# Patient Record
Sex: Female | Born: 1960 | Race: White | Hispanic: No | Marital: Single | Smoking: Former smoker
Health system: Southern US, Community
[De-identification: ages and names within clinical notes are randomized; demographics above are authoritative.]

## PROBLEM LIST (undated history)

## (undated) HISTORY — PX: HAND TENDON SURGERY: SHX663

---

## 2012-04-07 ENCOUNTER — Emergency Department (INDEPENDENT_AMBULATORY_CARE_PROVIDER_SITE_OTHER)
Admission: EM | Admit: 2012-04-07 | Discharge: 2012-04-07 | Disposition: A | Discharge status: Home / Self Care | Payer: Self-pay | Source: Home / Self Care | Attending: Emergency Medicine | Admitting: Emergency Medicine

## 2012-04-07 ENCOUNTER — Encounter (HOSPITAL_COMMUNITY): Payer: Self-pay | Admitting: Emergency Medicine

## 2012-04-07 ENCOUNTER — Emergency Department (INDEPENDENT_AMBULATORY_CARE_PROVIDER_SITE_OTHER): Payer: Self-pay

## 2012-04-07 DIAGNOSIS — M722 Plantar fascial fibromatosis: Secondary | ICD-10-CM

## 2012-04-07 MED ORDER — MELOXICAM 7.5 MG PO TABS: 7.5000 mg | ORAL_TABLET | Freq: Every day | ORAL | Status: AC

## 2012-04-07 NOTE — ED Notes (Signed)
Instructed on use of crutches.

## 2012-04-07 NOTE — ED Provider Notes (Signed)
History     CSN: [id number]  Arrival date & time 04/07/12  1126   First MD Initiated Contact with Patient 04/07/12 1140      Chief Complaint  Patient presents with  . Foot Pain    (Consider location/radiation/quality/duration/timing/severity/associated sxs/prior treatment) HPI Comments: Patient presents urgent care this morning complaining of pain on the undersurface of her right foot and on the lateral aspect of it. Describes it it hurts to walk on it or perform certain movements. Pain on the plantar surface radiates all way to her heel. She denies any recent injuries, falls or twisting motions or torsion that could have caused pain she works as a Armed forces operational officer. Denies any numbness, tingling sensations or redness or increased warmth. Denies any histories of plantar fasciitis or tendinitis of the foot.  Patient is a 51 y.o. female presenting with lower extremity pain. The history is provided by the patient.  Foot Pain This is a new problem. The current episode started 12 to 24 hours ago. The problem occurs constantly. The problem has not changed since onset.Pertinent negatives include no abdominal pain. Nothing relieves the symptoms. She has tried nothing for the symptoms.    History reviewed. No pertinent past medical history.  History reviewed. No pertinent past surgical history.  No family history on file.  History  Substance Use Topics  . Smoking status: Current Everyday Smoker -- 5 years    Types: Cigarettes  . Smokeless tobacco: Not on file  . Alcohol Use: Yes    OB History    Grav Para Term Preterm Abortions TAB SAB Ect Mult Living                  Review of Systems  Constitutional: Negative for fever, chills, activity change and appetite change.  Gastrointestinal: Negative for abdominal pain.  Musculoskeletal: Negative for myalgias and joint swelling.  Skin: Negative for color change, pallor, rash and wound.  Neurological: Negative for weakness and numbness.     Allergies  Penicillins  Home Medications   Current Outpatient Rx  Name Route Sig Dispense Refill  . MELOXICAM 7.5 MG PO TABS Oral Take 1 tablet (7.5 mg total) by mouth daily. 14 tablet 0    BP 120/68  Pulse 77  Temp 98.6 F (37 C) (Oral)  Resp 16  SpO2 100%  Physical Exam  Nursing note and vitals reviewed. Constitutional: Vital signs are normal. She appears well-developed and well-nourished.  Non-toxic appearance. She does not have a sickly appearance. She does not appear ill.  Musculoskeletal: She exhibits tenderness. She exhibits no edema.       Feet:  Skin: Skin is warm. No erythema.    ED Course  Procedures (including critical care time)  Labs Reviewed - No data to display Dg Foot Complete Right  04/07/2012  *RADIOLOGY REPORT*  Clinical Data: Pain laterally.  No trauma.  RIGHT FOOT COMPLETE - 3+ VIEW  Comparison: None.  Findings: No fracture or dislocation.  No plain film evidence of stress injury.  No plantar spur.  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  Degenerative changes.  IMPRESSION: No acute abnormality.  Please see above.   Original Report Authenticated By: Fuller Canada, M.D.      1. Plantar fasciitis of right foot       MDM  Patient with plantar foot pain in the lateral aspect of her foot. With negative x-rays ruling out a spontaneous fifth metatarsal fracture. Patient has a foot tendinitis along  with plantar fasciitis. Patient has been prescribed he 2 week course of meloxicam. Provide her with a postop shoe. Occurs to follow-up with orthopedic provider for further treatment options the pain was to persist beyond 2 weeks.        Jimmie Molly, MD 04/07/12 2103

## 2012-04-07 NOTE — ED Notes (Signed)
This am, woke with right foot pain, lateral aspect starting at forefoot radiating to heel. No known injury. Works as Armed forces operational officer.

## 2013-10-03 ENCOUNTER — Emergency Department (INDEPENDENT_AMBULATORY_CARE_PROVIDER_SITE_OTHER)
Admission: EM | Admit: 2013-10-03 | Discharge: 2013-10-03 | Disposition: A | Discharge status: Home / Self Care | Payer: Self-pay | Source: Home / Self Care | Attending: Family Medicine | Admitting: Family Medicine

## 2013-10-03 ENCOUNTER — Encounter (HOSPITAL_COMMUNITY): Payer: Self-pay | Admitting: Emergency Medicine

## 2013-10-03 DIAGNOSIS — L723 Sebaceous cyst: Secondary | ICD-10-CM

## 2013-10-03 MED ORDER — HYDROCODONE-ACETAMINOPHEN 5-325 MG PO TABS: 2.0000 | ORAL_TABLET | ORAL | Status: DC | PRN

## 2013-10-03 MED ORDER — SULFAMETHOXAZOLE-TRIMETHOPRIM 800-160 MG PO TABS: 1.0000 | ORAL_TABLET | Freq: Two times a day (BID) | ORAL | Status: DC

## 2013-10-03 NOTE — ED Notes (Signed)
Pt has    A marble  Size  Lump epigastric  Area  That  Is  Round  Red   And  painfull     Pt    Noticed  The  Symptoms  X  3  Day

## 2013-10-03 NOTE — Discharge Instructions (Signed)

## 2013-10-03 NOTE — ED Provider Notes (Signed)
CSN: [id number]     Arrival date & time 10/03/13  1107 History   First MD Initiated Contact with Patient 10/03/13 1302     Chief Complaint  Patient presents with  . Recurrent Skin Infections     (Consider location/radiation/quality/duration/timing/severity/associated sxs/prior Treatment) Patient is a 53 y.o. female presenting with abscess. The history is provided by the patient. No language interpreter was used.  Abscess Location:  Torso Torso abscess location:  R chest Size:  2 Abscess quality: painful and redness   Red streaking: no   Duration:  3 days Progression:  Worsening Pain details:    Quality:  No pain   Severity:  No pain   Duration:  3 days   Timing:  Constant   Progression:  Worsening Relieved by:  Nothing Worsened by:  Nothing tried   History reviewed. No pertinent past medical history. History reviewed. No pertinent past surgical history. History reviewed. No pertinent family history. History  Substance Use Topics  . Smoking status: Current Every Day Smoker -- 5 years    Types: Cigarettes  . Smokeless tobacco: Not on file  . Alcohol Use: Yes   OB History   Grav Para Term Preterm Abortions TAB SAB Ect Mult Living                 Review of Systems  Skin: Positive for wound.  All other systems reviewed and are negative.      Allergies  Penicillins  Home Medications  No current outpatient prescriptions on file. BP 144/90  Pulse 65  Temp(Src) 97.9 F (36.6 C) (Oral)  Resp 16  SpO2 97% Physical Exam  Vitals reviewed. Constitutional: She appears well-developed and well-nourished.  Pulmonary/Chest: Effort normal.  2cm erythema   Musculoskeletal: Normal range of motion.  Neurological: She is alert.  Skin: There is erythema.    ED Course  INCISION AND DRAINAGE Date/Time: 10/03/2013 1:45 PM Performed by: Elson AreasSOFIA, Haizlee Henton K Authorized by: Clementeen GrahamOREY, EVAN, S Consent: Verbal consent not obtained. Risks and benefits: risks, benefits and  alternatives were discussed Consent given by: patient Patient identity confirmed: verbally with patient Time out: Immediately prior to procedure a "time out" was called to verify the correct patient, procedure, equipment, support staff and site/side marked as required. Type: cyst Anesthesia: local infiltration Local anesthetic: lidocaine 2% without epinephrine Scalpel size: 11 Needle gauge: 18 Incision type: single straight Complexity: simple Drainage: purulent Drainage amount: moderate Wound treatment: wound left open Patient tolerance: Patient tolerated the procedure well with no immediate complications. Comments: sebaceous material is expressed    (including critical care time) Labs Review Labs Reviewed - No data to display Imaging Review No results found.    MDM   Final diagnoses:  Sebaceous cyst    Bactrim and and hydrocodone    Elson AreasLeslie K Bridger Pizzi, PA-C 10/03/13 1349  Lonia SkinnerLeslie K Dames QuarterSofia, [place] JerseyPA-C 10/03/13 1355

## 2013-10-07 NOTE — ED Provider Notes (Signed)
Medical screening examination/treatment/procedure(s) were performed by a resident physician or non-physician practitioner and as the supervising physician I was immediately available for consultation/collaboration.  Reighan Hipolito, MD    Marita Burnsed S Zadin Lange, MD 10/07/13 1633 

## 2014-11-06 ENCOUNTER — Ambulatory Visit (INDEPENDENT_AMBULATORY_CARE_PROVIDER_SITE_OTHER): Payer: 59 | Admitting: Family Medicine

## 2014-11-06 ENCOUNTER — Ambulatory Visit (INDEPENDENT_AMBULATORY_CARE_PROVIDER_SITE_OTHER): Payer: 59

## 2014-11-06 VITALS — BP 124/76 | HR 94 | Temp 98.4°F | Resp 17 | Ht 65.5 in | Wt 190.0 lb

## 2014-11-06 DIAGNOSIS — S76311A Strain of muscle, fascia and tendon of the posterior muscle group at thigh level, right thigh, initial encounter: Secondary | ICD-10-CM | POA: Diagnosis not present

## 2014-11-06 DIAGNOSIS — Z131 Encounter for screening for diabetes mellitus: Secondary | ICD-10-CM

## 2014-11-06 DIAGNOSIS — E875 Hyperkalemia: Secondary | ICD-10-CM | POA: Diagnosis not present

## 2014-11-06 DIAGNOSIS — M7918 Myalgia, other site: Secondary | ICD-10-CM

## 2014-11-06 DIAGNOSIS — M791 Myalgia: Secondary | ICD-10-CM | POA: Diagnosis not present

## 2014-11-06 DIAGNOSIS — Z13220 Encounter for screening for lipoid disorders: Secondary | ICD-10-CM

## 2014-11-06 DIAGNOSIS — Z130 Encounter for screening for diseases of the blood and blood-forming organs and certain disorders involving the immune mechanism: Secondary | ICD-10-CM | POA: Diagnosis not present

## 2014-11-06 DIAGNOSIS — S76011A Strain of muscle, fascia and tendon of right hip, initial encounter: Secondary | ICD-10-CM

## 2014-11-06 MED ORDER — MELOXICAM 7.5 MG PO TABS: 7.5000 mg | ORAL_TABLET | Freq: Every day | ORAL | Status: DC

## 2014-11-06 MED ORDER — CYCLOBENZAPRINE HCL 10 MG PO TABS: 10.0000 mg | ORAL_TABLET | Freq: Two times a day (BID) | ORAL | Status: DC | PRN

## 2014-11-06 NOTE — Progress Notes (Addendum)
[facility] [address] [place] [address] [phone]  Date:  11/06/2014   Name:  Alexandra Houston   DOB:  11-08-1960   MRN:  [id number]  PCP:  No primary care provider on file.    Chief Complaint: Back Pain and pain in buttocks region   History of Present Illness:  Alexandra Houston is a 54 y.o. very pleasant female patient who presents with the following:  Here today as a new patient with back pain.  I have seen her in the past for a WC injury She has been working hard on exercise and diet change- she has lost about 12 lbs and is very pleased.  However about 3 weeks ago she had a mis-step while power walking and pulled her lower back/ buttock.  She did not fall, but wrenched her right buttock/ lower back.  She has used some heat and backed off her walking to lesser distance.  However she continues to have painShe has to sit on the left side of her behind because it hurts to sit on the right.  She does not have pain down her legs.   She has also tried some ibuprofen and massaging the area.   She would also like to catch up on her health maintenance and have labs- she only had a yogurt at 6am, which was approx 9 hours ago now  There are no active problems to display for this patient.   No past medical history on file.  No past surgical history on file.  History  Substance Use Topics  . Smoking status: Former Smoker -- 5 years    Types: Cigarettes    Quit date: 09/07/2013  . Smokeless tobacco: Not on file  . Alcohol Use: 0.0 oz/week    0 Standard drinks or equivalent per week    No family history on file.  Allergies  Allergen Reactions  . Penicillins     Medication list has been reviewed and updated.  No current outpatient prescriptions on file prior to visit.   No current facility-administered medications on file prior to visit.    Review of Systems:  As per HPI- otherwise negative.   Physical Examination: Filed Vitals:   11/06/14 1403   BP: 124/76  Pulse: 94  Temp: 98.4 F (36.9 C)  Resp: 17   Filed Vitals:   11/06/14 1403  Height: 5' 5.5" (1.664 m)  Weight: 190 lb (86.183 kg)   Body mass index is 31.13 kg/(m^2). Ideal Body Weight: Weight in (lb) to have BMI = 25: 152.2  GEN: WDWN, NAD, Non-toxic, A & O x 3, overweight, looks well HEENT: Atraumatic, Normocephalic. Neck supple. No masses, No LAD. Ears and Nose: No external deformity. CV: RRR, No M/G/R. No JVD. No thrill. No extra heart sounds. PULM: CTA B, no wheezes, crackles, rhonchi. No retractions. No resp. distress. No accessory muscle use. EXTR: No c/c/e NEURO Normal gait.  PSYCH: Normally interactive. Conversant. Not depressed or anxious appearing.  Calm demeanor.  Right buttock is sore to palpation over the sciatic notch.  No rash or lesion Normal BLE strength, sensation and DTR, negative SLR bilaterally  [facility] reading (PRIMARY) by  Dr. Patsy Lager. Right hip: negative  Assessment and Plan: Strain of right buttock, initial encounter - Plan: cyclobenzaprine (FLEXERIL) 10 MG tablet, meloxicam (MOBIC) 7.5 MG tablet  Pain in right buttock - Plan: DG HIP UNILAT WITH PELVIS 2-3 VIEWS RIGHT  Screening for hyperlipidemia - Plan: Lipid panel  Screening for  deficiency anemia - Plan: CBC  Screening for diabetes mellitus - Plan: Comprehensive metabolic panel  Treat for buttock strain with mobic and flexeril, encouraged heat and tennis ball massage Labs pending as above for screening See patient instructions for more details.     Signed Abbe AmsterdamJessica Jakell Trusty, MD  Received her elevated K level 3/22: will call her later this morning and ask her to come by for a lab draw only to recheck K

## 2014-11-06 NOTE — Patient Instructions (Addendum)
I will be in touch with your labs   It appears that you have strained a muscle in your buttock.  I do think that this will get well, but it can be an annoying injury!  Use the mobic as needed for pain during the day and the flexeril at night (you can also take flexeril during the day if you are not driving)  Try massage with a tennis ball, heat and stretching.  If you are not improving over the next 7- 10 days please let me know

## 2014-11-07 ENCOUNTER — Other Ambulatory Visit (INDEPENDENT_AMBULATORY_CARE_PROVIDER_SITE_OTHER): Payer: 59 | Admitting: Radiology

## 2014-11-07 ENCOUNTER — Encounter: Payer: Self-pay | Admitting: Family Medicine

## 2014-11-07 ENCOUNTER — Telehealth: Payer: Self-pay

## 2014-11-07 DIAGNOSIS — E875 Hyperkalemia: Secondary | ICD-10-CM

## 2014-11-07 LAB — LIPID PANEL
Cholesterol: 265 mg/dL — ABNORMAL HIGH (ref 0–200)
HDL: 51 mg/dL (ref >=46)
LDL CALC: 196 mg/dL — AB (ref 0–99)
Total CHOL/HDL Ratio: 5.2 Ratio
Triglycerides: 88 mg/dL (ref <150)
VLDL: 18 mg/dL (ref 0–40)

## 2014-11-07 LAB — COMPREHENSIVE METABOLIC PANEL
ALK PHOS: 83 U/L (ref 39–117)
ALT: 14 U/L (ref 0–35)
AST: 27 U/L (ref 0–37)
Albumin: 4.7 g/dL (ref 3.5–5.2)
BUN: 16 mg/dL (ref 6–23)
CO2: 21 mEq/L (ref 19–32)
CREATININE: 0.51 mg/dL (ref 0.50–1.10)
Calcium: 9.2 mg/dL (ref 8.4–10.5)
Chloride: 105 mEq/L (ref 96–112)
Glucose, Bld: 88 mg/dL (ref 70–99)
Potassium: 6.3 mEq/L (ref 3.5–5.3)
Sodium: 138 mEq/L (ref 135–145)
Total Bilirubin: 0.6 mg/dL (ref 0.2–1.2)
Total Protein: 7.5 g/dL (ref 6.0–8.3)

## 2014-11-07 LAB — CBC
HCT: 43.5 % (ref 36.0–46.0)
Hemoglobin: 14.6 g/dL (ref 12.0–15.0)
MCH: 30.3 pg (ref 26.0–34.0)
MCHC: 33.6 g/dL (ref 30.0–36.0)
MCV: 90.2 fL (ref 78.0–100.0)
MPV: 12.8 fL — AB (ref 8.6–12.4)
PLATELETS: 170 10*3/uL (ref 150–400)
RBC: 4.82 MIL/uL (ref 3.87–5.11)
RDW: 13.7 % (ref 11.5–15.5)
WBC: 7.7 10*3/uL (ref 4.0–10.5)

## 2014-11-07 LAB — POTASSIUM: POTASSIUM: 4.6 meq/L (ref 3.5–5.3)

## 2014-11-07 NOTE — Addendum Note (Signed)
Addended by: Pearline CablesOPLAND, Babbie Dondlinger C on: 11/07/2014 06:48 AM   Modules accepted: Orders

## 2014-11-07 NOTE — Telephone Encounter (Signed)
[facility] lab called to call report a high potassium of 6.3 but the blood was hemolyzed. I did not seed any notes on the lab. You probably already saw this. Dr. Patsy Lageropland

## 2014-11-07 NOTE — Telephone Encounter (Signed)
I have called pt but [facility] asked her to come in for a repeat K level today

## 2014-11-28 ENCOUNTER — Ambulatory Visit (INDEPENDENT_AMBULATORY_CARE_PROVIDER_SITE_OTHER): Payer: Worker's Compensation | Admitting: Family Medicine

## 2014-11-28 ENCOUNTER — Ambulatory Visit: Payer: Worker's Compensation

## 2014-11-28 VITALS — BP 120/72 | HR 94 | Temp 98.3°F | Resp 18 | Ht 66.0 in | Wt 188.0 lb

## 2014-11-28 DIAGNOSIS — W540XXA Bitten by dog, initial encounter: Secondary | ICD-10-CM

## 2014-11-28 DIAGNOSIS — S61451A Open bite of right hand, initial encounter: Secondary | ICD-10-CM | POA: Diagnosis not present

## 2014-11-28 DIAGNOSIS — Z23 Encounter for immunization: Secondary | ICD-10-CM

## 2014-11-28 NOTE — Patient Instructions (Signed)

## 2014-11-28 NOTE — Progress Notes (Signed)
   Subjective:    Patient ID: Alexandra Houston, female    DOB: Nov 19, 1960, 54 y.o.   MRN: [id number]  HPI Here today with a dog bite to her right hand.  This occurred around 11:30 today while grooming the animal She notes that her right thumb seems to be numb.    She does not know that date of her last tetanus shot-   She is right handed  Review of Systems As per HPI- otherwise negative.     Objective:   Physical Exam  Filed Vitals:   11/28/14 1305  BP: 120/72  Pulse: 94  Temp: 98.3 F (36.8 C)  Resp: 18    GEN: WDWN, NAD, Non-toxic, A & O x 3 HEENT: Atraumatic, Normocephalic. Neck supple. No masses, No LAD. Ears and Nose: No external deformity. CV: RRR, No M/G/R. No JVD. No thrill. No extra heart sounds. PULM: CTA B, no wheezes, crackles, rhonchi. No retractions. No resp. distress. No accessory muscle use. ABD: S, NT, ND, +BS. No rebound. No HSM. Right hand:  EXTR: No c/c/e NEURO Normal gait.  PSYCH: Normally interactive. Conversant. Not depressed or anxious appearing.  Calm demeanor.  Right hand: there is a small bite wound over the dorsum of the [facility] which is [facility], does not appear infected.  A couple of other tiny bite marks that do not appear to have broken the skin over the thenar eminence  She notes tenderness around the 1st MCP joint but otherwise no pain.  She does note that her thumb feels tingly to touch.  Normal strength, perfusion and cap refill of the hand.   [facility] reading (PRIMARY) by  Dr. Patsy Lageropland. Right hand: negative for acute fracture. Possible tiny chip at thumb IP joint but pt is not tender there RIGHT HAND - COMPLETE 3+ VIEW  COMPARISON: None.  FINDINGS: There is no evidence of fracture or dislocation. There is no evidence of arthropathy or other focal bone abnormality. Soft tissues are unremarkable.  IMPRESSION: Negative.  Placed in a left thumb spica splint    Assessment & Plan:  Dog bite of right hand, initial encounter - Plan: DG Hand  Complete Right  Immunization due - Plan: Tdap vaccine greater than or equal to 7yo IM  Bite wound, right hand/   Placed in a thumb spica splint OOW until recheck here on Thursday Tdap updated Watch for any sign of infection  Realized that I am not sure if she did a bite form.  [facility]- if this was not done please let me know- we can do Thursday if needed or she may come by and do tomorrow.  Certainly the dog that was at her facility is presumed to be UTD on immunizations but we will be sure all is ok.

## 2014-11-30 ENCOUNTER — Ambulatory Visit (INDEPENDENT_AMBULATORY_CARE_PROVIDER_SITE_OTHER): Payer: Worker's Compensation | Admitting: Family Medicine

## 2014-11-30 ENCOUNTER — Telehealth: Payer: Self-pay

## 2014-11-30 VITALS — BP 118/70 | HR 76 | Temp 97.9°F | Resp 18 | Ht 67.0 in | Wt 188.0 lb

## 2014-11-30 DIAGNOSIS — W540XXD Bitten by dog, subsequent encounter: Secondary | ICD-10-CM | POA: Diagnosis not present

## 2014-11-30 DIAGNOSIS — M79601 Pain in right arm: Secondary | ICD-10-CM | POA: Diagnosis not present

## 2014-11-30 DIAGNOSIS — S76311A Strain of muscle, fascia and tendon of the posterior muscle group at thigh level, right thigh, initial encounter: Secondary | ICD-10-CM | POA: Diagnosis not present

## 2014-11-30 DIAGNOSIS — S61451D Open bite of right hand, subsequent encounter: Secondary | ICD-10-CM | POA: Diagnosis not present

## 2014-11-30 DIAGNOSIS — S76011A Strain of muscle, fascia and tendon of right hip, initial encounter: Secondary | ICD-10-CM

## 2014-11-30 MED ORDER — PREDNISONE 20 MG PO TABS: ORAL_TABLET | ORAL | Status: DC

## 2014-11-30 MED ORDER — CYCLOBENZAPRINE HCL 10 MG PO TABS: 10.0000 mg | ORAL_TABLET | Freq: Two times a day (BID) | ORAL | Status: DC | PRN

## 2014-11-30 MED ORDER — MELOXICAM 7.5 MG PO TABS: 7.5000 mg | ORAL_TABLET | Freq: Every day | ORAL | Status: DC

## 2014-11-30 NOTE — Telephone Encounter (Signed)
This has been taken care of by dr copland.

## 2014-11-30 NOTE — Telephone Encounter (Signed)
Patient is calling because she was seen today and has a question a work note she was given by Dr. Patsy Lageropland. Patient states that she was under the impressions that her note was supposed to state that she will be excused from work starting today until her next appointment April 25th. Patient states that the note she has only excused her for one day. Patient states that she is coming back right now to get it straightened. Phone: [phone]

## 2014-11-30 NOTE — Patient Instructions (Signed)
Good to see you today-  Let's try using a short course of prednisone for the symptoms in your hand.  If you have any worsening or other concerns call me when you are out of town Otherwise let's plan to see you on Monday 4/25 for a recheck

## 2014-11-30 NOTE — Progress Notes (Signed)
Alexandra CongRachel Nethery 18-Jul-1961 54 y.o.   Chief Complaint  Patient presents with  . Follow-up    dog bite     Date of Injury: she was here following a dog bite that occurred on 4/12- she was not started on abx but was placed in a wrist splint. She was bitten on her right hand.  She is right hand cominant She is here today for a follow-up. Her hand still hurts if she tries to hold her dog clippers- she tried this am to see if she might be able to get back to work. She also tried just holding her brush or scissors and could not do this either. She has persistent pain and numbness in the thumb/ thenar eminence.  The thumb "feels sort of numb" which is worrying her.    She did complete a bite report at the time of the original bite and has heard from animal control   She has not noted any fever.  She has no heat in the hand, no redness and no pus.  She has not noted any sign of infection   Right hand film taken on 4/12  RIGHT HAND - COMPLETE 3+ VIEW  COMPARISON: None.  FINDINGS: There is no evidence of fracture or dislocation. There is no evidence of arthropathy or other focal bone abnormality. Soft tissues are unremarkable.  IMPRESSION: Negative.  History of Present Illness:  Presents for evaluation of work-related complaint.   ROS   Allergies  Allergen Reactions  . Penicillins     Current medications reviewed and updated. Past medical history, family history, social history have been reviewed and updated.   Physical Exam GEN: WDWN, NAD, Non-toxic, A & O x 3, overweight, looks well HEENT: Atraumatic, Normocephalic. Neck supple. No masses, No LAD. Ears and Nose: No external deformity. CV: RRR, No M/G/R. No JVD. No thrill. No extra heart sounds. PULM: CTA B, no wheezes, crackles, rhonchi. No retractions. No resp. distress. No accessory muscle use. EXTR: No c/c/e NEURO Normal gait.  PSYCH: Normally interactive. Conversant. Not depressed or anxious appearing.  Calm  demeanor.  Right hand: she has a small wound of over the dorsal right hand which is tender (over the [facility]nd [facility]) but appears to be healing well, no redness, pus, or heat.  She is still tender and slightly swollen over the thenar eminence.  She has slight tenderness with movement of the 1st MCP, but not at the IP joint of the thumb.  She has normal strength of both hand.  She does notice that sharp touch feels dull over the thumb only Assessment and Plan:  Pain of right upper extremity - Plan: predniSONE (DELTASONE) 20 MG tablet  Dog bite of right hand, subsequent encounter - Plan: predniSONE (DELTASONE) 20 MG tablet  Strain of right buttock, initial encounter - Plan: cyclobenzaprine (FLEXERIL) 10 MG tablet, meloxicam (MOBIC) 7.5 MG tablet  Here today to recheck dog bite of the right hand.  She has tried to use her grooming tools at home and found that she had too much pain. She has a pre-planned vacation next week and will be out of the state until Sunday 4/24.  Will take her OOW, and she will recheck her the following Monday 4/25.  Will try a short course of prednisone for the paraesthesias in her hand which are likely due to soft tissue swelling.  If the sensation change is not getting better or if any worsening she will let me know in the meantime.  Refilled  her flexeril and mobic, no NSAIDs while on prednisone

## 2014-12-11 ENCOUNTER — Ambulatory Visit (INDEPENDENT_AMBULATORY_CARE_PROVIDER_SITE_OTHER): Payer: Worker's Compensation | Admitting: Physician Assistant

## 2014-12-11 VITALS — BP 128/64 | HR 78 | Temp 97.6°F | Resp 17 | Ht 66.5 in | Wt 190.2 lb

## 2014-12-11 DIAGNOSIS — M6289 Other specified disorders of muscle: Secondary | ICD-10-CM

## 2014-12-11 DIAGNOSIS — R202 Paresthesia of skin: Secondary | ICD-10-CM | POA: Diagnosis not present

## 2014-12-11 DIAGNOSIS — R200 Anesthesia of skin: Secondary | ICD-10-CM

## 2014-12-11 DIAGNOSIS — R29898 Other symptoms and signs involving the musculoskeletal system: Secondary | ICD-10-CM

## 2014-12-11 NOTE — Progress Notes (Signed)
Alexandra CongRachel Houston February 24, 1961 54 y.o.   Chief Complaint  Patient presents with  . Follow-up    Workers Comp, Administrator, artsDog Bite to right hand     Date of Injury: 11/28/2014  History of Present Illness:  Patient is here for follow up after 12 days post dog injury to her right dominant hand.  Today, she reports no improvement of pain, numbness and tingling.  She is compliant with using her brace, she states.  She took the prednisone initially given, without any resolve.  She has pain at her 1st metacarpal with any pressure.  The pressure then incites numbness and tingling around to the dorsal side of 1st metacarpal and up the hand.  She has numbness, of her distal 1st digit.  She denies any fever, increased swelling, or redness.     Presents for evaluation of work-related complaint.   ROS ROS otherwise unremarkable unless listed above.   Allergies  Allergen Reactions  . Penicillins    Current medications reviewed and updated. Past medical history, family history, social history have been reviewed and updated.   Physical Exam  Constitutional: She is oriented to person, place, and time and well-developed, well-nourished, and in no distress. No distress.  Eyes: Pupils are equal, round, and reactive to light.  Cardiovascular: Normal rate.   Pulmonary/Chest: Effort normal. No respiratory distress.  Musculoskeletal:  Right metacarpal 1st digit tenderness upon palpation of MCP, this travels distally causing tingling and numbness.  Dorsum of hand with healing <cm wound, with very little erythema or swelling.  Decreased grip strength and opposition 2/5.  Negative Tinel, Phalen.  Positive Finkelstein.  Neurological: She is alert and oriented to person, place, and time.  Decreased distal sensation.  No dull sensation or distinction of dull and sharp pain. Proximal to DIP joint is intact sensation.  Skin: Skin is warm and dry.  Psychiatric: Affect and judgment normal.   BP 128/64 mmHg  Pulse 78   Temp(Src) 97.6 F (36.4 C) (Oral)  Resp 17  Ht 5' 6.5" (1.689 m)  Wt 190 lb 3.2 oz (86.274 kg)  BMI 30.24 kg/m2  SpO2 96%   Assessment and Plan: 54 year old female is here today for chief complaint of continued thumb pain and paresthesia.  Positive deQuervain's though this is not consistent with an injury and hx.   At this time, hand specialist, is appreciated at this time for consult.   -Restricted to wear arm brace to work.   -Referral placed. -Restriction of NO use of the right arm.  Continue meloxicam. -Restriction form notice given in carbon copy.   Numbness and tingling of left thumb - Plan: Ambulatory referral to Hand Surgery  Weakness of left hand - Plan: Ambulatory referral to Hand Surgery  Trena PlattStephanie Biruk Troia, PA-C [facility] 4/25/20161:48 PM

## 2014-12-21 ENCOUNTER — Telehealth: Payer: Self-pay

## 2014-12-21 NOTE — Telephone Encounter (Signed)
Will notify xr.

## 2014-12-21 NOTE — Telephone Encounter (Signed)
Pt needs to pick up an xray disc done on 12/28/14 of LEFT hand.  Will need to pick up tomorrow to take to an ortho appt

## 2015-03-07 ENCOUNTER — Ambulatory Visit (INDEPENDENT_AMBULATORY_CARE_PROVIDER_SITE_OTHER): Payer: Commercial Managed Care - HMO | Admitting: Family Medicine

## 2015-03-07 ENCOUNTER — Ambulatory Visit (INDEPENDENT_AMBULATORY_CARE_PROVIDER_SITE_OTHER): Payer: Commercial Managed Care - HMO

## 2015-03-07 VITALS — BP 130/82 | HR 69 | Temp 98.2°F | Resp 18 | Ht 66.0 in | Wt 190.2 lb

## 2015-03-07 DIAGNOSIS — S92911A Unspecified fracture of right toe(s), initial encounter for closed fracture: Secondary | ICD-10-CM | POA: Diagnosis not present

## 2015-03-07 DIAGNOSIS — M79671 Pain in right foot: Secondary | ICD-10-CM

## 2015-03-07 NOTE — Patient Instructions (Signed)
You're fourth toe does appear to be broken in the middle portion of the first bone. It does not appear to affect the joints.  You can use the postop shoe for the next 2-3 weeks, buddy tape the third and fourth toe for the next 3-4 weeks, or until pain has improved. Elevate foot when seated tablet lessen pain and swelling. If any worsening or not improving in the next 4 weeks return for recheck.   Toe Fracture Your caregiver has diagnosed you as having a fractured toe. A toe fracture is a break in the bone of a toe. "Buddy taping" is a way of splinting your broken toe, by taping the broken toe to the toe next to it. This "buddy taping" will keep the injured toe from moving beyond normal range of motion. Buddy taping also helps the toe heal in a more normal alignment. It may take 6 to 8 weeks for the toe injury to heal. HOME CARE INSTRUCTIONS   Leave your toes taped together for as long as directed by your caregiver or until you see a doctor for a follow-up examination. You can change the tape after bathing. Always use a small piece of gauze or cotton between the toes when taping them together. This will help the skin stay dry and prevent infection.  Apply ice to the injury for 15-20 minutes each hour while awake for the first 2 days. Put the ice in a plastic bag and place a towel between the bag of ice and your skin.  After the first 2 days, apply heat to the injured area. Use heat for the next 2 to 3 days. Place a heating pad on the foot or soak the foot in warm water as directed by your caregiver.  Keep your foot elevated as much as possible to lessen swelling.  Wear sturdy, supportive shoes. The shoes should not pinch the toes or fit tightly against the toes.  Your caregiver may prescribe a rigid shoe if your foot is very swollen.  Your may be given crutches if the pain is too great and it hurts too much to walk.  Only take over-the-counter or prescription medicines for pain, discomfort, or  fever as directed by your caregiver.  If your caregiver has given you a follow-up appointment, it is very important to keep that appointment. Not keeping the appointment could result in a chronic or permanent injury, pain, and disability. If there is any problem keeping the appointment, you must call back to this facility for assistance. SEEK MEDICAL CARE IF:   You have increased pain or swelling, not relieved with medications.  The pain does not get better after 1 week.  Your injured toe is cold when the others are warm. SEEK IMMEDIATE MEDICAL CARE IF:   The toe becomes cold, numb, or white.  The toe becomes hot (inflamed) and red. Document Released: 08/01/2000 Document Revised: 10/27/2011 Document Reviewed: 03/20/2008 [facility] Patient Information 2015 [place], [place]. This information is not intended to replace advice given to you by your health care provider. Make sure you discuss any questions you have with your health care provider.

## 2015-03-07 NOTE — Progress Notes (Addendum)
Subjective:    Patient ID: Alexandra Houston, female    DOB: Jan 01, 1961, 54 y.o.   MRN: [id number] This chart was scribed for Meredith Staggers, MD by Jolene Provost, Medical Scribe. This patient was seen in Room 4 and the patient's care was started a 6:40 PM.  Chief Complaint  Patient presents with  . Foot Injury    right foot injury x 1 week. Canned food fell from grocery bag onto foot. Still hurting    HPI HPI Comments: Alexandra Houston is a 54 y.o. female who presents to [facility] complaining of a right foot injury that happened six days ago. Pt states she had a bag of groceries with cans in them, which broke and cans of foot fell on her right foot. Pt states her foot swelled and bruised. Pt states she has used ice, heat, and elevation without relief of her sx.     There are no active problems to display for this patient.  No past medical history on file. Past Surgical History  Procedure Laterality Date  . Hand tendon surgery    . Cesarean section     Allergies  Allergen Reactions  . Penicillins    Prior to Admission medications   Medication Sig Start Date End Date Taking? Authorizing Provider  cyclobenzaprine (FLEXERIL) 10 MG tablet Take 1 tablet (10 mg total) by mouth 2 (two) times daily as needed for muscle spasms. Patient not taking: Reported on 03/07/2015 11/30/14   Pearline Cables, MD  meloxicam (MOBIC) 7.5 MG tablet Take 1 tablet (7.5 mg total) by mouth daily. Patient not taking: Reported on 03/07/2015 11/30/14   Pearline Cables, MD   History   Social History  . Marital Status: Single    Spouse Name: N/A  . Number of Children: N/A  . Years of Education: N/A   Occupational History  . Not on file.   Social History Main Topics  . Smoking status: Former Smoker -- 5 years    Types: Cigarettes    Quit date: 09/07/2013  . Smokeless tobacco: Not on file  . Alcohol Use: 0.0 oz/week    0 Standard drinks or equivalent per week  . Drug Use: Not on file  . Sexual Activity: Not  on file   Other Topics Concern  . Not on file   Social History Narrative    Review of Systems  Constitutional: Negative for fever and chills.  Musculoskeletal: Positive for joint swelling and gait problem.  Skin: Positive for color change. Negative for wound.  Neurological: Negative for weakness and numbness.       Objective:   Physical Exam  Constitutional: She is oriented to person, place, and time. She appears well-developed and well-nourished. No distress.  HENT:  Head: Normocephalic and atraumatic.  Eyes: Pupils are equal, round, and reactive to light.  Neck: Neck supple.  Cardiovascular: Normal rate.   Pulmonary/Chest: Effort normal. No respiratory distress.  Musculoskeletal: She exhibits edema and tenderness.  Diffusely tender over distal third and fourth metatarsals with faint ecchymosis and swelling distally. Increasing tenderness moving distally ,third greater than fourth toe. NVI distally. Guarded ROM in the third and fourth toes.    Neurological: She is alert and oriented to person, place, and time. Coordination normal.  Skin: Skin is warm and dry. She is not diaphoretic.  Psychiatric: She has a normal mood and affect. Her behavior is normal.  Nursing note and vitals reviewed.  Filed Vitals:   03/07/15 1826  BP: 130/82  Pulse:  69  Temp: 98.2 F (36.8 C)  TempSrc: Oral  Resp: 18  Height: 5\' 6"  (1.676 m)  Weight: 190 lb 4 oz (86.297 kg)  SpO2: 98%   [facility] reading (PRIMARY) by  Dr. Neva SeatGreene:  Right foot: Fourth proximal phalanx fracture through middle aspect, does not appear to affect MTP or proximal interphalangeal joint. Minimal to no displacement.      Assessment & Plan:   Alexandra CongRachel Houston is a 54 y.o. female Right foot pain - Plan: DG Foot Complete Right  Toe fracture, right, closed, initial encounter  Appears to have nondisplaced or minimally displaced fourth proximal phalanx fracture. Does not appear to affect the joints spaces.    -Buddy taped to the  third toe, and postop shoe placed. Continue buddy taping for 3-4 weeks and postop shoe for a few weeks as needed for comfort.   Note provided for work if she needs to wear postop shoe at work.  - Discussed allowing skin between toes to air out during the day if she is not walking on it and when repeating buddy taping to place gauze between toes to lessen skin irritation.   -RTC precautions if still symptomatic in 3-4 weeks.  No orders of the defined types were placed in this encounter.   Patient Instructions  You're fourth toe does appear to be broken in the middle portion of the first bone. It does not appear to affect the joints.  You can use the postop shoe for the next 2-3 weeks, buddy tape the third and fourth toe for the next 3-4 weeks, or until pain has improved. Elevate foot when seated tablet lessen pain and swelling. If any worsening or not improving in the next 4 weeks return for recheck.   Toe Fracture Your caregiver has diagnosed you as having a fractured toe. A toe fracture is a break in the bone of a toe. "Buddy taping" is a way of splinting your broken toe, by taping the broken toe to the toe next to it. This "buddy taping" will keep the injured toe from moving beyond normal range of motion. Buddy taping also helps the toe heal in a more normal alignment. It may take 6 to 8 weeks for the toe injury to heal. HOME CARE INSTRUCTIONS   Leave your toes taped together for as long as directed by your caregiver or until you see a doctor for a follow-up examination. You can change the tape after bathing. Always use a small piece of gauze or cotton between the toes when taping them together. This will help the skin stay dry and prevent infection.  Apply ice to the injury for 15-20 minutes each hour while awake for the first 2 days. Put the ice in a plastic bag and place a towel between the bag of ice and your skin.  After the first 2 days, apply heat to the injured area. Use heat for the next  2 to 3 days. Place a heating pad on the foot or soak the foot in warm water as directed by your caregiver.  Keep your foot elevated as much as possible to lessen swelling.  Wear sturdy, supportive shoes. The shoes should not pinch the toes or fit tightly against the toes.  Your caregiver may prescribe a rigid shoe if your foot is very swollen.  Your may be given crutches if the pain is too great and it hurts too much to walk.  Only take over-the-counter or prescription medicines for pain, discomfort, or fever  as directed by your caregiver.  If your caregiver has given you a follow-up appointment, it is very important to keep that appointment. Not keeping the appointment could result in a chronic or permanent injury, pain, and disability. If there is any problem keeping the appointment, you must call back to this facility for assistance. SEEK MEDICAL CARE IF:   You have increased pain or swelling, not relieved with medications.  The pain does not get better after 1 week.  Your injured toe is cold when the others are warm. SEEK IMMEDIATE MEDICAL CARE IF:   The toe becomes cold, numb, or white.  The toe becomes hot (inflamed) and red. Document Released: 08/01/2000 Document Revised: 10/27/2011 Document Reviewed: 03/20/2008 [facility] Patient Information 2015 [place], [place]. This information is not intended to replace advice given to you by your health care provider. Make sure you discuss any questions you have with your health care provider.       I personally performed the services described in this documentation, which was scribed in my presence. The recorded information has been reviewed and considered, and addended by me as needed.

## 2015-03-09 ENCOUNTER — Ambulatory Visit (INDEPENDENT_AMBULATORY_CARE_PROVIDER_SITE_OTHER): Payer: Commercial Managed Care - HMO | Admitting: Family Medicine

## 2015-03-09 VITALS — BP 116/72 | HR 92 | Temp 98.6°F | Resp 18 | Ht 66.0 in | Wt 186.0 lb

## 2015-03-09 DIAGNOSIS — S92911D Unspecified fracture of right toe(s), subsequent encounter for fracture with routine healing: Secondary | ICD-10-CM

## 2015-03-09 MED ORDER — HYDROCODONE-ACETAMINOPHEN 5-325 MG PO TABS: 1.0000 | ORAL_TABLET | Freq: Four times a day (QID) | ORAL | Status: DC | PRN

## 2015-03-09 NOTE — Patient Instructions (Signed)
Continue to buddy tape third to fourth toes, wear postop shoe, but decrease amount of walking on that foot for the next day or 2. If you need to modify work schedule Monday or Tuesday next week and you need a note for this, let me know. Hydrocodone up to every 4-6 hours as needed. Once symptoms have improved, can switch to over-the-counter Tylenol, Advil, or Aleve.    Follow up with me next week if this pain has not started to improve.  Return to the clinic or go to the nearest emergency room if any of your symptoms worsen or new symptoms occur.

## 2015-03-09 NOTE — Progress Notes (Signed)
Subjective:  This chart was scribed for Alexandra Staggers, Alexandra Houston by Alexandra Houston, medical scribe at [facility].The patient was seen in exam room 03 and the patient's care was started at 5:34 PM.   Patient ID: Alexandra Houston, female    DOB: 1961-06-25, 54 y.o.   MRN: [id number] Chief Complaint  Patient presents with  . Follow-up    rt toe   . Toe Pain    pain is worse    HPI HPI Comments: Alexandra Houston is a 54 y.o. female who presents to [facility] for a follow up regarding a worsening right toe injury. Pt was last seen on 03/07/2015 and the injury occurred six days prior when a can from her grocery bag fell on her right foot. X-ray showed a fracture of the fourth proximal phalanx  through middle aspect, which did not appear to affect the MTP or proximal interphalangeal joint. There was minimal to no displacement. X-ray over read suspicious findings for non displaced right fourth toe proximal phalanx fracture. She was advised to buddy tape the toe and her foot was placed in a postop shoe. Pt was told to return if the toe worsens.  Today, her toe is burning "like someone placed a hot poker right through her toe". She did have pain when she woke up and pain worsened throughout the day. She was unable to sleep last night. 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  She has not taken any medications for this.     There are no active problems to display for this patient.  History reviewed. No pertinent past medical history. Past Surgical History  Procedure Laterality Date  . Hand tendon surgery    . Cesarean section     Allergies  Allergen Reactions  . Penicillins    Prior to Admission medications   Not on File   History   Social History  . Marital Status: Single    Spouse Name: N/A  . Number of Children: N/A  . Years of Education: N/A   Occupational History  . Not on file.   Social  History Main Topics  . Smoking status: Former Smoker -- 5 years    Types: Cigarettes    Quit date: 09/07/2013  . Smokeless tobacco: Not on file  . Alcohol Use: 0.0 oz/week    0 Standard drinks or equivalent per week  . Drug Use: Not on file  . Sexual Activity: Not on file   Other Topics Concern  . Not on file   Social History Narrative  Review of Systems  Musculoskeletal: Positive for arthralgias and gait problem.      Objective:  BP 116/72 mmHg  Pulse 92  Temp(Src) 98.6 F (37 C) (Oral)  Resp 18  Ht 5\' 6"  (1.676 m)  Wt 186 lb (84.369 kg)  BMI 30.04 kg/m2  SpO2 98% Physical Exam  Constitutional: She is oriented to person, place, and time. She appears well-developed and well-nourished. No distress.  HENT:  Head: Normocephalic and atraumatic.  Eyes: Pupils are equal, round, and reactive to light.  Neck: Normal range of motion.  Cardiovascular: Normal rate and regular rhythm.   Pulmonary/Chest: Effort normal. No respiratory distress.  Musculoskeletal: Normal range of motion.  Right fourth toe, skin is intact. Slight soft tissue with faint ecchymosis at the base of her toe. The toe is warm with cap refill less than  one second at the distal toe. Tenderness along the distal fourth metatarsal.  Neurological: She is alert and oriented to person, place, and time.  Skin: Skin is warm and dry.  Psychiatric: She has a normal mood and affect. Her behavior is normal.  Nursing note and vitals reviewed.     Assessment & Plan:   Alexandra Houston is a 54 y.o. female Toe fracture, right, with routine healing, subsequent encounter - Plan: HYDROcodone-acetaminophen (NORCO/VICODIN) 5-325 MG per tablet  - 4th toe fracture, nondisplaced. Possible overuse/too much activity on affected foot today.   -note for out of work next 2 days, hydrocodone if needed as below. SED.  Wean to otc nsaid or tylenol as tolerated.   -continue post op shoe, buddy taping.  rtc precautions if pain persists.   Meds  ordered this encounter  Medications  . HYDROcodone-acetaminophen (NORCO/VICODIN) 5-325 MG per tablet    Sig: Take 1 tablet by mouth every 6 (six) hours as needed for moderate pain.    Dispense:  15 tablet    Refill:  0   Patient Instructions   Continue to buddy tape third to fourth toes, wear postop shoe, but decrease amount of walking on that foot for the next day or 2. If you need to modify work schedule Monday or Tuesday next week and you need a note for this, let me know. Hydrocodone up to every 4-6 hours as needed. Once symptoms have improved, can switch to over-the-counter Tylenol, Advil, or Aleve.    Follow up with me next week if this pain has not started to improve.  Return to the clinic or go to the nearest emergency room if any of your symptoms worsen or new symptoms occur.     I personally performed the services described in this documentation, which was scribed in my presence. The recorded information has been reviewed and considered, and addended by me as needed.

## 2018-05-05 ENCOUNTER — Emergency Department (HOSPITAL_COMMUNITY): Payer: Self-pay

## 2018-05-05 ENCOUNTER — Emergency Department (HOSPITAL_COMMUNITY)
Admission: EM | Admit: 2018-05-05 | Discharge: 2018-05-05 | Disposition: A | Discharge status: Home / Self Care | Payer: Self-pay | Attending: Emergency Medicine | Admitting: Emergency Medicine

## 2018-05-05 ENCOUNTER — Encounter (HOSPITAL_COMMUNITY): Payer: Self-pay

## 2018-05-05 DIAGNOSIS — Z87891 Personal history of nicotine dependence: Secondary | ICD-10-CM | POA: Insufficient documentation

## 2018-05-05 DIAGNOSIS — R1031 Right lower quadrant pain: Secondary | ICD-10-CM | POA: Insufficient documentation

## 2018-05-05 DIAGNOSIS — R1030 Lower abdominal pain, unspecified: Secondary | ICD-10-CM

## 2018-05-05 DIAGNOSIS — R112 Nausea with vomiting, unspecified: Secondary | ICD-10-CM | POA: Insufficient documentation

## 2018-05-05 DIAGNOSIS — R197 Diarrhea, unspecified: Secondary | ICD-10-CM

## 2018-05-05 LAB — URINALYSIS, ROUTINE W REFLEX MICROSCOPIC
Bilirubin Urine: NEGATIVE
GLUCOSE, UA: NEGATIVE mg/dL
Ketones, ur: 5 mg/dL — AB
NITRITE: NEGATIVE
PH: 6 (ref 5.0–8.0)
Protein, ur: NEGATIVE mg/dL
Specific Gravity, Urine: 1.018 (ref 1.005–1.030)

## 2018-05-05 LAB — COMPREHENSIVE METABOLIC PANEL
ALK PHOS: 97 U/L (ref 38–126)
ALT: 13 U/L (ref 0–44)
ANION GAP: 13 (ref 5–15)
AST: 20 U/L (ref 15–41)
Albumin: 4.1 g/dL (ref 3.5–5.0)
BILIRUBIN TOTAL: 1 mg/dL (ref 0.3–1.2)
BUN: 9 mg/dL (ref 6–20)
CALCIUM: 9.6 mg/dL (ref 8.9–10.3)
CO2: 24 mmol/L (ref 22–32)
CREATININE: 0.69 mg/dL (ref 0.44–1.00)
Chloride: 105 mmol/L (ref 98–111)
GFR calc non Af Amer: >60 mL/min (ref >60)
Glucose, Bld: 86 mg/dL (ref 70–99)
Potassium: 3.6 mmol/L (ref 3.5–5.1)
SODIUM: 142 mmol/L (ref 135–145)
TOTAL PROTEIN: 7.5 g/dL (ref 6.5–8.1)

## 2018-05-05 LAB — CBC
HCT: 47 % — ABNORMAL HIGH (ref 36.0–46.0)
HEMOGLOBIN: 15 g/dL (ref 12.0–15.0)
MCH: 29.6 pg (ref 26.0–34.0)
MCHC: 31.9 g/dL (ref 30.0–36.0)
MCV: 92.7 fL (ref 78.0–100.0)
Platelets: 184 10*3/uL (ref 150–400)
RBC: 5.07 MIL/uL (ref 3.87–5.11)
RDW: 12.7 % (ref 11.5–15.5)
WBC: 6.2 10*3/uL (ref 4.0–10.5)

## 2018-05-05 LAB — LIPASE, BLOOD: Lipase: 31 U/L (ref 11–51)

## 2018-05-05 MED ORDER — DIPHENHYDRAMINE HCL 50 MG/ML IJ SOLN
12.5000 mg | Freq: Once | INTRAMUSCULAR | Status: AC
  Administered 2018-05-05: 12.5 mg via INTRAVENOUS
  Filled 2018-05-05: qty 1

## 2018-05-05 MED ORDER — SODIUM CHLORIDE 0.9 % IV BOLUS
1000.0000 mL | Freq: Once | INTRAVENOUS | Status: AC
  Administered 2018-05-05: 1000 mL via INTRAVENOUS

## 2018-05-05 MED ORDER — ONDANSETRON HCL 4 MG/2ML IJ SOLN
4.0000 mg | Freq: Once | INTRAMUSCULAR | Status: AC
  Administered 2018-05-05: 4 mg via INTRAVENOUS
  Filled 2018-05-05: qty 2

## 2018-05-05 MED ORDER — IOHEXOL 300 MG/ML  SOLN
100.0000 mL | Freq: Once | INTRAMUSCULAR | Status: AC | PRN
  Administered 2018-05-05: 100 mL via INTRAVENOUS

## 2018-05-05 MED ORDER — ONDANSETRON HCL 4 MG PO TABS: 4.0000 mg | ORAL_TABLET | Freq: Three times a day (TID) | ORAL | 0 refills | Status: DC | PRN

## 2018-05-05 MED ORDER — MORPHINE SULFATE (PF) 4 MG/ML IV SOLN
4.0000 mg | Freq: Once | INTRAVENOUS | Status: AC
  Administered 2018-05-05: 4 mg via INTRAVENOUS
  Filled 2018-05-05: qty 1

## 2018-05-05 MED ORDER — DICYCLOMINE HCL 20 MG PO TABS: 20.0000 mg | ORAL_TABLET | Freq: Four times a day (QID) | ORAL | 0 refills | Status: DC | PRN

## 2018-05-05 NOTE — ED Notes (Signed)
ED Provider at bedside. 

## 2018-05-05 NOTE — ED Triage Notes (Signed)
Pt presents for evaluation of RLQ pain x 6 days. Reports pain worsened and started having N/V/D in the past 24 hours. Patient reports she had to leave work yesterday because it was difficult to stand

## 2018-05-05 NOTE — Discharge Instructions (Addendum)

## 2018-05-05 NOTE — ED Provider Notes (Signed)
[facility]   CSN: [id number] Arrival date & time: 05/05/18  1421     History   Chief Complaint Chief Complaint  Patient presents with  . Abdominal Pain    HPI Alexandra Houston is a 57 y.o. female who presents the emergency department chief complaint of right lower quadrant abdominal pain.  Patient states that she has had progressively worsening pain in the right lower quadrant of her abdomen which she describes as "rolling pain" across her lower abdomen.  She states that most of the time it is aching but at times it is more sharp and severe.  2 days ago she began having nausea vomiting and diarrhea and has had decreased p.o. intake.  The pain is worse with movement and ambulation.  She had to leave work early yesterday because of the pain in her belly.  She denies a history of previous surgeries to her abdomen.  She denies urinary symptoms.  Her nausea and vomiting have slowed.  She tried to eat some crackers earlier today but 20 minutes later had vomiting.  HPI  History reviewed. No pertinent past medical history.  There are no active problems to display for this patient.   Past Surgical History:  Procedure Laterality Date  . CESAREAN SECTION    . HAND TENDON SURGERY       OB History   None      Home Medications    Prior to Admission medications   Medication Sig Start Date End Date Taking? Authorizing Provider  HYDROcodone-acetaminophen (NORCO/VICODIN) 5-325 MG per tablet Take 1 tablet by mouth every 6 (six) hours as needed for moderate pain. 03/09/15   Shade FloodGreene, Jeffrey R, MD    Family History No family history on file.  Social History Social History   Tobacco Use  . Smoking status: Former Smoker    Years: 5.00    Types: Cigarettes    Last attempt to quit: 09/07/2013    Years since quitting: 4.6  Substance Use Topics  . Alcohol use: Yes    Alcohol/week: 0.0 standard drinks  . Drug use: Not on file      Allergies   Penicillins   Review of Systems Review of Systems  Ten systems reviewed and are negative for acute change, except as noted in the HPI.   Physical Exam Updated Vital Signs BP (!) 157/98   Pulse 77   Temp 98.7 F (37.1 C) (Oral)   Resp 18   SpO2 98%   Physical Exam  Constitutional: She is oriented to person, place, and time. She appears well-developed and well-nourished. No distress.  HENT:  Head: Normocephalic and atraumatic.  Eyes: Pupils are equal, round, and reactive to light. Conjunctivae and EOM are normal. No scleral icterus.  Neck: Normal range of motion.  Cardiovascular: Normal rate, regular rhythm and normal heart sounds. Exam reveals no gallop and no friction rub.  No murmur heard. Pulmonary/Chest: Effort normal and breath sounds normal. No respiratory distress.  Abdominal: Soft. Bowel sounds are normal. She exhibits no distension and no mass. There is tenderness in the right lower quadrant, suprapubic area and left lower quadrant.  Neurological: She is alert and oriented to person, place, and time.  Skin: Skin is warm and dry. She is not diaphoretic.  Psychiatric: Her behavior is normal.  Nursing note and vitals reviewed.    ED Treatments / Results  Labs (all labs ordered are listed, but only abnormal results are displayed) Labs Reviewed  CBC - Abnormal; Notable for the following components:      Result Value   HCT 47.0 (*)    All other components within normal limits  URINALYSIS, ROUTINE W REFLEX MICROSCOPIC - Abnormal; Notable for the following components:   Hgb urine dipstick SMALL (*)    Ketones, ur 5 (*)    Leukocytes, UA TRACE (*)    Bacteria, UA RARE (*)    All other components within normal limits  LIPASE, BLOOD  COMPREHENSIVE METABOLIC PANEL    EKG None  Radiology No results found.  Procedures Procedures (including critical care time)  Medications Ordered in ED Medications  sodium chloride 0.9 % bolus 1,000 mL (has  no administration in time range)  morphine 4 MG/ML injection 4 mg (4 mg Intravenous Given 05/05/18 1806)  ondansetron (ZOFRAN) injection 4 mg (4 mg Intravenous Given 05/05/18 1806)     Initial Impression / Assessment and Plan / ED Course  I have reviewed the triage vital signs and the nursing notes.  Pertinent labs & imaging results that were available during my care of the patient were reviewed by me and considered in my medical decision making (see chart for details).     Patient with abdominal pain. The causes for generalized abdominal pain include but are not limited to gastritis, gastroenteritis, IBS, pancreatitis, peritonitis, intestinal ischemia, constipation, UTI, intestinal obstruction, perforated viscus, eg, peptic ulcer, appendix, gallbladder, diverticulitis, physical or sexual abuse, abdominal abscess, ruptured spleen, AAA, diabetic ketoacidosis, hypercalcemia, uremia, parasitic or other infection, eg: tapeworms, flukes, Giargia, Cryto, Yersinia, adrenal insufficiency,lead poisoning, iron toxicity, polyarteritis nodosa, Henoch-Schnlein purpura, porphyria, eg, acute intermittent porphyria, familial Mediterranean fever.  Patient CT scan is negative for acute abnormality.  I reviewed the patient's labs and her urine appears contaminated.  Labs are otherwise reassuring  Patient is nontoxic, nonseptic appearing, in no apparent distress.  Patient's pain and other symptoms adequately managed in emergency department.  Fluid bolus given.  Labs, imaging and vitals reviewed.  Patient does not meet the SIRS or Sepsis criteria.  On repeat exam patient does not have a surgical abdomin and there are no peritoneal signs.  No indication of appendicitis, bowel obstruction, bowel perforation, cholecystitis, diverticulitis,  Patient discharged home with symptomatic treatment and given strict instructions for follow-up with their primary care physician.  I have also discussed reasons to return immediately to  the ER.  Patient expresses understanding and agrees with plan.     Final Clinical Impressions(s) / ED Diagnoses   Final diagnoses:  None    ED Discharge Orders    None       Arthor Captain, PA-C 05/06/18 0118    Tegeler, Canary Brim, MD 05/06/18 [id number]

## 2019-02-25 ENCOUNTER — Ambulatory Visit (INDEPENDENT_AMBULATORY_CARE_PROVIDER_SITE_OTHER): Payer: Self-pay

## 2019-02-25 ENCOUNTER — Ambulatory Visit (HOSPITAL_COMMUNITY)
Admission: EM | Admit: 2019-02-25 | Discharge: 2019-02-25 | Disposition: A | Discharge status: Home / Self Care | Payer: Self-pay | Attending: Internal Medicine | Admitting: Internal Medicine

## 2019-02-25 ENCOUNTER — Encounter (HOSPITAL_COMMUNITY): Payer: Self-pay

## 2019-02-25 DIAGNOSIS — S93602A Unspecified sprain of left foot, initial encounter: Secondary | ICD-10-CM

## 2019-02-25 MED ORDER — NAPROXEN 375 MG PO TABS: 375.0000 mg | ORAL_TABLET | Freq: Two times a day (BID) | ORAL | 0 refills | Status: DC

## 2019-02-25 NOTE — ED Triage Notes (Signed)
Pt C/O left foot injury from a fall 8 days ago.  Pt states that her foot was feeling alright after she fall last Thursday but as the days went by. She begin to feel pain and tightness as she walked and apply pressure to her left foot.

## 2019-02-25 NOTE — ED Provider Notes (Signed)
[facility]    CSN: [id number] Arrival date & time: 02/25/19  1216      History   Chief Complaint Chief Complaint  Patient presents with  . Foot Injury    HPI Alexandra Houston is a 58 y.o. female with no past medical history comes to urgent care with complaints of left foot pain following a fall 8 days ago.  Patient sustained a fall while she was trying to get out of a house.  Patient has not been able to bear weight on the left foot.  She has been ambulating holding her foot in eversion.  Pain is of moderate severity.  Is aggravated by putting weight on the foot.  No known relieving factors.  Patient has some numbness of the great toe.  No discoloration of the left toe.   HPI  History reviewed. No pertinent past medical history.  There are no active problems to display for this patient.   Past Surgical History:  Procedure Laterality Date  . CESAREAN SECTION    . HAND TENDON SURGERY      OB History   No obstetric history on file.      Home Medications    Prior to Admission medications   Medication Sig Start Date End Date Taking? Authorizing Provider  dicyclomine (BENTYL) 20 MG tablet Take 1 tablet (20 mg total) by mouth 4 (four) times daily as needed for spasms. 05/05/18   Arthor CaptainHarris, Abigail, PA-C  HYDROcodone-acetaminophen (NORCO/VICODIN) 5-325 MG per tablet Take 1 tablet by mouth every 6 (six) hours as needed for moderate pain. 03/09/15   Shade FloodGreene, Jeffrey R, MD  ondansetron (ZOFRAN) 4 MG tablet Take 1 tablet (4 mg total) by mouth every 8 (eight) hours as needed for nausea or vomiting. 05/05/18   Arthor CaptainHarris, Abigail, PA-C    Family History History reviewed. No pertinent family history.  Social History Social History   Tobacco Use  . Smoking status: Former Smoker    Years: 5.00    Types: Cigarettes    Quit date: 09/07/2013    Years since quitting: 5.4  . Smokeless tobacco: Former Engineer, waterUser  Substance Use Topics  . Alcohol use: Yes    Alcohol/week: 0.0 standard  drinks  . Drug use: Not on file     Allergies   Penicillins   Review of Systems Review of Systems  Constitutional: Positive for activity change. Negative for fatigue and fever.  HENT: Negative.   Respiratory: Negative.   Cardiovascular: Negative.   Gastrointestinal: Negative.   Genitourinary: Negative.   Musculoskeletal: Positive for arthralgias, gait problem and joint swelling. Negative for back pain, myalgias, neck pain and neck stiffness.  Neurological: Negative for dizziness, facial asymmetry, weakness and light-headedness.     Physical Exam Triage Vital Signs ED Triage Vitals [02/25/19 1252]  Enc Vitals Group     BP (!) 144/94     Pulse Rate 98     Resp 18     Temp 98.5 F (36.9 C)     Temp Source Oral     SpO2 100 %     Weight      Height      Head Circumference      Peak Flow      Pain Score 7     Pain Loc      Pain Edu?      Excl. in [place]?    No data found.  Updated Vital Signs BP (!) 144/94 (BP Location: Right Arm)   Pulse 98  Temp 98.5 F (36.9 C) (Oral)   Resp 18   SpO2 100%   Visual Acuity Right Eye Distance:   Left Eye Distance:   Bilateral Distance:    Right Eye Near:   Left Eye Near:    Bilateral Near:     Physical Exam Constitutional:      General: She is not in acute distress.    Appearance: Normal appearance. She is not ill-appearing or toxic-appearing.  Cardiovascular:     Rate and Rhythm: Normal rate and regular rhythm.  Pulmonary:     Effort: Pulmonary effort is normal. No respiratory distress.     Breath sounds: Normal breath sounds. No stridor. No wheezing.  Musculoskeletal:        General: Swelling, tenderness and signs of injury present. No deformity.     Left lower leg: No edema.  Skin:    General: Skin is warm.     Capillary Refill: Capillary refill takes less than 2 seconds.     Findings: No bruising or erythema.  Neurological:     General: No focal deficit present.     Mental Status: She is alert and oriented  to person, place, and time.      UC Treatments / Results  Labs (all labs ordered are listed, but only abnormal results are displayed) Labs Reviewed - No data to display  EKG   Radiology No results found.  Procedures Procedures (including critical care time)  Medications Ordered in UC Medications - No data to display  Initial Impression / Assessment and Plan / UC Course  I have reviewed the triage vital signs and the nursing notes.  Pertinent labs & imaging results that were available during my care of the patient were reviewed by me and considered in my medical decision making (see chart for details).     1.  Traumatic left foot pain: X-ray of the left foot is negative for acute fracture Postop boot prescribed for the patient. Naproxen 375 mg twice daily Rest, ice, elevation and gentle range of motion  Final Clinical Impressions(s) / UC Diagnoses   Final diagnoses:  None   Discharge Instructions   None    ED Prescriptions    None     Controlled Substance Prescriptions [place] Controlled Substance Registry consulted? No   Chase Picket, MD 02/25/19 [phone]

## 2019-08-01 ENCOUNTER — Encounter (HOSPITAL_COMMUNITY): Payer: Self-pay | Admitting: Emergency Medicine

## 2019-08-01 ENCOUNTER — Ambulatory Visit (HOSPITAL_COMMUNITY)
Admission: EM | Admit: 2019-08-01 | Discharge: 2019-08-01 | Disposition: A | Discharge status: Home / Self Care | Payer: Self-pay | Attending: Physician Assistant | Admitting: Physician Assistant

## 2019-08-01 ENCOUNTER — Other Ambulatory Visit: Payer: Self-pay

## 2019-08-01 DIAGNOSIS — Z20828 Contact with and (suspected) exposure to other viral communicable diseases: Secondary | ICD-10-CM | POA: Insufficient documentation

## 2019-08-01 DIAGNOSIS — Z880 Allergy status to penicillin: Secondary | ICD-10-CM | POA: Insufficient documentation

## 2019-08-01 DIAGNOSIS — Z87891 Personal history of nicotine dependence: Secondary | ICD-10-CM | POA: Insufficient documentation

## 2019-08-01 DIAGNOSIS — R071 Chest pain on breathing: Secondary | ICD-10-CM | POA: Insufficient documentation

## 2019-08-01 DIAGNOSIS — Z791 Long term (current) use of non-steroidal anti-inflammatories (NSAID): Secondary | ICD-10-CM | POA: Insufficient documentation

## 2019-08-01 DIAGNOSIS — J069 Acute upper respiratory infection, unspecified: Secondary | ICD-10-CM | POA: Insufficient documentation

## 2019-08-01 DIAGNOSIS — R030 Elevated blood-pressure reading, without diagnosis of hypertension: Secondary | ICD-10-CM | POA: Insufficient documentation

## 2019-08-01 NOTE — Discharge Instructions (Addendum)
If your Covid-19 test is positive, you will receive a phone call from [facility] regarding your results. Negative test results are not called. Both positive and negative results area always visible on MyChart. If you do not have a MyChart account, sign up instructions are in your discharge papers.   Persons who are directed to care for themselves at home may discontinue isolation under the following conditions:   At least 10 days have passed since symptom onset and  At least 24 hours have passed without running a fever (this means without the use of fever-reducing medications) and  Other symptoms have improved.  Persons infected with COVID-19 who never develop symptoms may discontinue isolation and other precautions 10 days after the date of their first positive COVID-19 test.   You may utilize tylenol 325mg  x 2 tablets up to every 6 hours for fever and body aches. Drink plenty of fluids and stay well rested.  Your blood pressure was elevated today at 157/84, I have attached information for 2 clinics you may call to schedule with or return to this urgent care in 2 weeks for a re-check.  Go to the Emergency Department, If you were to have severe chest pain, shortness of breath, feel as though you may pass out, have severe diarrhea

## 2019-08-01 NOTE — ED Provider Notes (Signed)
[facility]    CSN: [id number] Arrival date & time: 08/01/19  1032      History   Chief Complaint Chief Complaint  Patient presents with  . Nasal Congestion  . Back Pain  . Chest Pain    HPI Alexandra Houston is a 58 y.o. female.   Patient reports to urgent care today for 5 days of sinus congestion, ear fullness, chest pressure, mild cough and fatige. She notes that on Wednesday or Thursday of last week she had runny nose, sinus pressure and ear fullness, for which she took advil cold/flu. These symptoms resolved into Thursday night. Friday she notes that she went to work and "just did not feel right" and went home. She woke Saturday morning with recurrence of previous symptoms and now with the addition of chest discomfort and back pain . She describes the chest discomfort as chest pressure and this pressure is constant and has slight increase with deep breathing. She denies radiation of this discomfort. She has no shortness of breath, no nausea, vomiting or diarrhea.  She has a slight cough, described "as to clear my throat". She denies sore throat, fever or chills.   She denies Covid contact. Denies loss of taste or smell.   When asked about blood pressure history, she denies history and does not regularly follow with a provider.      History reviewed. No pertinent past medical history.  There are no problems to display for this patient.   Past Surgical History:  Procedure Laterality Date  . CESAREAN SECTION    . HAND TENDON SURGERY      OB History   No obstetric history on file.      Home Medications    Prior to Admission medications   Medication Sig Start Date End Date Taking? Authorizing Provider  naproxen (NAPROSYN) 375 MG tablet Take 1 tablet (375 mg total) by mouth 2 (two) times daily. 02/25/19   Lamptey, Britta MccreedyPhilip O, MD  dicyclomine (BENTYL) 20 MG tablet Take 1 tablet (20 mg total) by mouth 4 (four) times daily as needed for spasms. 05/05/18 02/25/19   Arthor CaptainHarris, Abigail, PA-C    Family History History reviewed. No pertinent family history.  Social History Social History   Tobacco Use  . Smoking status: Former Smoker    Years: 5.00    Types: Cigarettes    Quit date: 09/07/2013    Years since quitting: 5.9  . Smokeless tobacco: Former Engineer, waterUser  Substance Use Topics  . Alcohol use: Yes    Alcohol/week: 0.0 standard drinks  . Drug use: Not on file     Allergies   Penicillins   Review of Systems Review of Systems  Constitutional: Positive for fatigue. Negative for activity change, chills and fever.  HENT: Positive for congestion, postnasal drip, rhinorrhea and sinus pressure. Negative for ear discharge, ear pain, sinus pain and sore throat.   Eyes: Negative for visual disturbance.  Respiratory: Positive for chest tightness. Negative for cough and shortness of breath.   Cardiovascular: Negative for chest pain and palpitations.  Gastrointestinal: Negative for abdominal pain, nausea and vomiting.  Endocrine: Negative for polyuria.  Genitourinary: Negative for dysuria and urgency.  Musculoskeletal: Positive for back pain and myalgias. Negative for arthralgias.  Skin: Negative for color change and rash.  Neurological: Negative for syncope, weakness, light-headedness and headaches.  Psychiatric/Behavioral: Negative.   All other systems reviewed and are negative.    Physical Exam Triage Vital Signs ED Triage Vitals [08/01/19 1051]  Enc  Vitals Group     BP      Pulse      Resp      Temp      Temp src      SpO2      Weight      Height      Head Circumference      Peak Flow      Pain Score 6     Pain Loc      Pain Edu?      Excl. in [place]?    No data found.  Updated Vital Signs BP (!) 157/84 (BP Location: Left Arm)   Pulse 74   Temp 98.2 F (36.8 C) (Oral)   Resp 12   SpO2 99%   Visual Acuity Right Eye Distance:   Left Eye Distance:   Bilateral Distance:    Right Eye Near:   Left Eye Near:    Bilateral  Near:     Physical Exam Vitals and nursing note reviewed.  Constitutional:      General: She is not in acute distress.    Appearance: She is well-developed. She is not ill-appearing.  HENT:     Head: Normocephalic and atraumatic.  Eyes:     Conjunctiva/sclera: Conjunctivae normal.     Pupils: Pupils are equal, round, and reactive to light.  Cardiovascular:     Rate and Rhythm: Normal rate and regular rhythm.     Pulses:          Carotid pulses are 2+ on the right side and 2+ on the left side.      Radial pulses are 2+ on the right side and 2+ on the left side.       Dorsalis pedis pulses are 2+ on the right side and 2+ on the left side.       Posterior tibial pulses are 2+ on the right side and 2+ on the left side.     Heart sounds: Normal heart sounds. Heart sounds not distant. No murmur. No systolic murmur. No diastolic murmur. No friction rub. No gallop.   Pulmonary:     Effort: Pulmonary effort is normal. No respiratory distress.     Breath sounds: Normal breath sounds.  Chest:     Chest wall: No tenderness.  Abdominal:     General: Bowel sounds are normal.     Palpations: Abdomen is soft.     Tenderness: There is no abdominal tenderness.  Musculoskeletal:        General: Normal range of motion.     Cervical back: Normal range of motion and neck supple.     Right lower leg: No edema.     Left lower leg: No edema.  Lymphadenopathy:     Cervical: No cervical adenopathy.  Skin:    General: Skin is warm and dry.     Capillary Refill: Capillary refill takes less than 2 seconds.  Neurological:     General: No focal deficit present.     Mental Status: She is alert and oriented to person, place, and time.  Psychiatric:        Mood and Affect: Mood normal.        Behavior: Behavior normal.      UC Treatments / Results  Labs (all labs ordered are listed, but only abnormal results are displayed) Labs Reviewed  NOVEL CORONAVIRUS, NAA (HOSP ORDER, SEND-OUT TO REF LAB; TAT  18-24 HRS)    EKG NSR at 65bpm. No ST  elevation or arrhythmia noted - no comparison   Radiology No results found.  Procedures Procedures (including critical care time)  Medications Ordered in UC Medications - No data to display  Initial Impression / Assessment and Plan / UC Course  I have reviewed the triage vital signs and the nursing notes.  Pertinent labs & imaging results that were available during my care of the patient were reviewed by me and considered in my medical decision making (see chart for details).     #Viral Upper Respiratory - Viral syndrome. No high risk covid exposure. PCR sent. Symptomatic treatment recommended.  #Chest Discomfort - ECG with no sign of cardiac disease. Likely secondary to viral illness. Precautions advised below  #Elevated Blood Pressure No history. Recommended recheck in 2 weeks and to establish primary care with resources given.   Final Clinical Impressions(s) / UC Diagnoses   Final diagnoses:  Viral URI  Chest pain on breathing  Elevated blood-pressure reading, without diagnosis of hypertension     Discharge Instructions     If your Covid-19 test is positive, you will receive a phone call from [facility] regarding your results. Negative test results are not called. Both positive and negative results area always visible on MyChart. If you do not have a MyChart account, sign up instructions are in your discharge papers.   Persons who are directed to care for themselves at home may discontinue isolation under the following conditions:  . At least 10 days have passed since symptom onset and . At least 24 hours have passed without running a fever (this means without the use of fever-reducing medications) and . Other symptoms have improved.  Persons infected with COVID-19 who never develop symptoms may discontinue isolation and other precautions 10 days after the date of their first positive COVID-19 test.   You may utilize  tylenol 325mg  x 2 tablets up to every 6 hours for fever and body aches. Drink plenty of fluids and stay well rested.  Your blood pressure was elevated today at 157/84, I have attached information for 2 clinics you may call to schedule with or return to this urgent care in 2 weeks for a re-check.  Go to the Emergency Department, If you were to have severe chest pain, shortness of breath, feel as though you may pass out, have severe diarrhea       ED Prescriptions    None     PDMP not reviewed this encounter.   , PA-C 08/01/19 1200

## 2019-08-01 NOTE — ED Triage Notes (Signed)
Pt reports a head cold that started on Thursday. She has had nasal congestion , with ear fullness and sometimes a scratchy throat  On Saturday she developed chest discomfort and back discomfort at the base of her lungs.  She denies SOB, cough, fever, N, V, D, ST,or loss of taste or smell.

## 2019-08-03 LAB — NOVEL CORONAVIRUS, NAA (HOSP ORDER, SEND-OUT TO REF LAB; TAT 18-24 HRS): SARS-CoV-2, NAA: NOT DETECTED

## 2020-04-23 ENCOUNTER — Encounter (HOSPITAL_COMMUNITY): Payer: Self-pay

## 2020-04-23 ENCOUNTER — Other Ambulatory Visit: Payer: Self-pay

## 2020-04-23 ENCOUNTER — Ambulatory Visit (INDEPENDENT_AMBULATORY_CARE_PROVIDER_SITE_OTHER): Payer: HRSA Program

## 2020-04-23 ENCOUNTER — Ambulatory Visit (HOSPITAL_COMMUNITY)
Admission: EM | Admit: 2020-04-23 | Discharge: 2020-04-23 | Disposition: A | Discharge status: Home / Self Care | Payer: HRSA Program | Attending: Family Medicine | Admitting: Family Medicine

## 2020-04-23 DIAGNOSIS — Z791 Long term (current) use of non-steroidal anti-inflammatories (NSAID): Secondary | ICD-10-CM | POA: Diagnosis not present

## 2020-04-23 DIAGNOSIS — J40 Bronchitis, not specified as acute or chronic: Secondary | ICD-10-CM | POA: Insufficient documentation

## 2020-04-23 DIAGNOSIS — Z20822 Contact with and (suspected) exposure to covid-19: Secondary | ICD-10-CM | POA: Diagnosis not present

## 2020-04-23 DIAGNOSIS — M549 Dorsalgia, unspecified: Secondary | ICD-10-CM | POA: Insufficient documentation

## 2020-04-23 DIAGNOSIS — Z87891 Personal history of nicotine dependence: Secondary | ICD-10-CM | POA: Diagnosis not present

## 2020-04-23 DIAGNOSIS — R05 Cough: Secondary | ICD-10-CM

## 2020-04-23 LAB — SARS CORONAVIRUS 2 (TAT 6-24 HRS): SARS Coronavirus 2: NEGATIVE

## 2020-04-23 MED ORDER — KETOROLAC TROMETHAMINE 30 MG/ML IJ SOLN
INTRAMUSCULAR | Status: AC
  Filled 2020-04-23: qty 1

## 2020-04-23 MED ORDER — TIZANIDINE HCL 4 MG PO TABS: 4.0000 mg | ORAL_TABLET | Freq: Four times a day (QID) | ORAL | 0 refills | Status: AC | PRN

## 2020-04-23 MED ORDER — BENZONATATE 100 MG PO CAPS: 100.0000 mg | ORAL_CAPSULE | Freq: Three times a day (TID) | ORAL | 0 refills | Status: AC

## 2020-04-23 MED ORDER — KETOROLAC TROMETHAMINE 30 MG/ML IJ SOLN
30.0000 mg | Freq: Once | INTRAMUSCULAR | Status: AC
  Administered 2020-04-23: 30 mg via INTRAMUSCULAR

## 2020-04-23 MED ORDER — ALBUTEROL SULFATE HFA 108 (90 BASE) MCG/ACT IN AERS: 1.0000 | INHALATION_SPRAY | Freq: Four times a day (QID) | RESPIRATORY_TRACT | 0 refills | Status: AC | PRN

## 2020-04-23 MED ORDER — PREDNISONE 10 MG PO TABS: 40.0000 mg | ORAL_TABLET | Freq: Every day | ORAL | 0 refills | Status: AC

## 2020-04-23 NOTE — ED Provider Notes (Signed)
[facility]    CSN: [id number] Arrival date & time: 04/23/20  6295      History   Chief Complaint Chief Complaint  Patient presents with  . Back Pain  . Cough    HPI Alexandra Houston is a 59 y.o. female.   Patient is a 59 year old female with no significant past medical history.  She presents today with cough for the past 3 days.  Coughing up sputum.  Concerned due to developing generalized back pain mostly in the upper back area since that evening.  Hard for her to get comfortable.  Not take any medicines to relieve her discomfort.  No fevers, chills, body aches, abdominal pain, nausea, vomiting     History reviewed. No pertinent past medical history.  There are no problems to display for this patient.   Past Surgical History:  Procedure Laterality Date  . CESAREAN SECTION    . HAND TENDON SURGERY      OB History   No obstetric history on file.      Home Medications    Prior to Admission medications   Medication Sig Start Date End Date Taking? Authorizing Provider  albuterol (VENTOLIN HFA) 108 (90 Base) MCG/ACT inhaler Inhale 1-2 puffs into the lungs every 6 (six) hours as needed for wheezing or shortness of breath. 04/23/20   Sueko Dimichele, Gloris Manchester A, NP  benzonatate (TESSALON) 100 MG capsule Take 1 capsule (100 mg total) by mouth every 8 (eight) hours. 04/23/20   Dahlia Byes A, NP  naproxen (NAPROSYN) 375 MG tablet Take 1 tablet (375 mg total) by mouth 2 (two) times daily. 02/25/19   Lamptey, Britta Mccreedy, MD  predniSONE (DELTASONE) 10 MG tablet Take 4 tablets (40 mg total) by mouth daily for 5 days. 04/23/20 04/28/20  Dahlia Byes A, NP  tiZANidine (ZANAFLEX) 4 MG tablet Take 1 tablet (4 mg total) by mouth every 6 (six) hours as needed for muscle spasms. 04/23/20   Dahlia Byes A, NP  dicyclomine (BENTYL) 20 MG tablet Take 1 tablet (20 mg total) by mouth 4 (four) times daily as needed for spasms. 05/05/18 02/25/19  Arthor Captain, PA-C    Family History History reviewed. No  pertinent family history.  Social History Social History   Tobacco Use  . Smoking status: Former Smoker    Years: 5.00    Types: Cigarettes    Quit date: 09/07/2013    Years since quitting: 6.6  . Smokeless tobacco: Former Engineer, water Use Topics  . Alcohol use: Yes    Alcohol/week: 0.0 standard drinks  . Drug use: Not on file     Allergies   Penicillins   Review of Systems Review of Systems   Physical Exam Triage Vital Signs ED Triage Vitals  Enc Vitals Group     BP 04/23/20 0909 (!) 155/95     Pulse Rate 04/23/20 0909 69     Resp 04/23/20 0909 19     Temp 04/23/20 0909 98.5 F (36.9 C)     Temp Source 04/23/20 0909 Oral     SpO2 04/23/20 0909 100 %     Weight --      Height --      Head Circumference --      Peak Flow --      Pain Score 04/23/20 0906 7     Pain Loc --      Pain Edu? --      Excl. in [place]? --    No data found.  Updated Vital Signs BP (!) 155/95 (BP Location: Right Arm)   Pulse 69   Temp 98.5 F (36.9 C) (Oral)   Resp 19   SpO2 100%   Visual Acuity Right Eye Distance:   Left Eye Distance:   Bilateral Distance:    Right Eye Near:   Left Eye Near:    Bilateral Near:     Physical Exam Vitals and nursing note reviewed.  Constitutional:      General: She is not in acute distress.    Appearance: Normal appearance. She is not ill-appearing, toxic-appearing or diaphoretic.     Comments: Appears uncomfortable  HENT:     Head: Normocephalic.     Nose: Nose normal.  Eyes:     Conjunctiva/sclera: Conjunctivae normal.  Cardiovascular:     Rate and Rhythm: Normal rate and regular rhythm.  Pulmonary:     Effort: Pulmonary effort is normal.     Comments: Decreased lung sounds Unable to take deep breaths.  Abdominal:     Palpations: Abdomen is soft.  Musculoskeletal:        General: Normal range of motion.     Cervical back: Normal range of motion.     Comments: Generalized upper and lower back muscle spasming and discomfort    Skin:    General: Skin is warm and dry.     Findings: No rash.  Neurological:     Mental Status: She is alert.  Psychiatric:        Mood and Affect: Mood normal.      UC Treatments / Results  Labs (all labs ordered are listed, but only abnormal results are displayed) Labs Reviewed  SARS CORONAVIRUS 2 (TAT 6-24 HRS)    EKG   Radiology DG Chest 2 View  Result Date: 04/23/2020 CLINICAL DATA:  Cough and severe back pain EXAM: CHEST - 2 VIEW COMPARISON:  None. FINDINGS: Bronchitic markings with airway cuffing best seen on the lateral view. There is no edema, consolidation, effusion, or pneumothorax. Normal heart size and mediastinal contours. No acute osseous finding. IMPRESSION: Bronchitic markings without pneumonia. Electronically Signed   By: Marnee Spring M.D.   On: 04/23/2020 09:45    Procedures Procedures (including critical care time)  Medications Ordered in UC Medications  ketorolac (TORADOL) 30 MG/ML injection 30 mg (30 mg Intramuscular Given 04/23/20 1012)    Initial Impression / Assessment and Plan / UC Course  I have reviewed the triage vital signs and the nursing notes.  Pertinent labs & imaging results that were available during my care of the patient were reviewed by me and considered in my medical decision making (see chart for details).     Bronchitis X-ray with bronchitic markings without pneumonia Treating with prednisone burst, albuterol inhaler as needed. Tessalon Perles for cough Zanaflex for muscle relaxant and muscle spasm in the back area. Toradol given here for pain Recommended heat to the back. Follow up as needed for continued or worsening symptoms  Final Clinical Impressions(s) / UC Diagnoses   Final diagnoses:  Bronchitis     Discharge Instructions     Treating you for bronchitis Medicine as prescribed Follow up as needed for continued or worsening symptoms      ED Prescriptions    Medication Sig Dispense Auth. Provider    predniSONE (DELTASONE) 10 MG tablet Take 4 tablets (40 mg total) by mouth daily for 5 days. 20 tablet Angelene Rome A, NP   albuterol (VENTOLIN HFA) 108 (90 Base) MCG/ACT inhaler Inhale  1-2 puffs into the lungs every 6 (six) hours as needed for wheezing or shortness of breath. 1 each Danyell Awbrey A, NP   benzonatate (TESSALON) 100 MG capsule Take 1 capsule (100 mg total) by mouth every 8 (eight) hours. 21 capsule Stacye Noori A, NP   tiZANidine (ZANAFLEX) 4 MG tablet Take 1 tablet (4 mg total) by mouth every 6 (six) hours as needed for muscle spasms. 30 tablet Dahlia Byes A, NP     PDMP not reviewed this encounter.   Janace Aris, NP 04/23/20 1034

## 2020-04-23 NOTE — ED Triage Notes (Signed)
Pt is here with a cough that started Saturday, Sunday morning the pt has developed back pain throughout Saturday night. Pt has not taken any meds to relieve discomfort.

## 2020-04-23 NOTE — Discharge Instructions (Signed)
Treating you for bronchitis Medicine as prescribed Follow up as needed for continued or worsening symptoms

## 2021-11-26 ENCOUNTER — Other Ambulatory Visit: Payer: Self-pay

## 2021-11-26 ENCOUNTER — Encounter (HOSPITAL_COMMUNITY): Payer: Self-pay | Admitting: Emergency Medicine

## 2021-11-26 ENCOUNTER — Ambulatory Visit (HOSPITAL_COMMUNITY)
Admission: EM | Admit: 2021-11-26 | Discharge: 2021-11-26 | Disposition: A | Discharge status: Home / Self Care | Payer: Self-pay | Attending: Internal Medicine | Admitting: Internal Medicine

## 2021-11-26 DIAGNOSIS — G44209 Tension-type headache, unspecified, not intractable: Secondary | ICD-10-CM | POA: Insufficient documentation

## 2021-11-26 DIAGNOSIS — T148XXA Other injury of unspecified body region, initial encounter: Secondary | ICD-10-CM | POA: Insufficient documentation

## 2021-11-26 LAB — CBC
HCT: 44.3 % (ref 36.0–46.0)
Hemoglobin: 15.2 g/dL — ABNORMAL HIGH (ref 12.0–15.0)
MCH: 31.1 pg (ref 26.0–34.0)
MCHC: 34.3 g/dL (ref 30.0–36.0)
MCV: 90.8 fL (ref 80.0–100.0)
Platelets: 176 10*3/uL (ref 150–400)
RBC: 4.88 MIL/uL (ref 3.87–5.11)
RDW: 12.5 % (ref 11.5–15.5)
WBC: 6.1 10*3/uL (ref 4.0–10.5)
nRBC: 0 % (ref 0.0–0.2)

## 2021-11-26 LAB — BASIC METABOLIC PANEL
Anion gap: 7 (ref 5–15)
BUN: 13 mg/dL (ref 8–23)
CO2: 28 mmol/L (ref 22–32)
Calcium: 9.4 mg/dL (ref 8.9–10.3)
Chloride: 106 mmol/L (ref 98–111)
Creatinine, Ser: 0.67 mg/dL (ref 0.44–1.00)
GFR, Estimated: >60 mL/min (ref >60)
Glucose, Bld: 107 mg/dL — ABNORMAL HIGH (ref 70–99)
Potassium: 4.7 mmol/L (ref 3.5–5.1)
Sodium: 141 mmol/L (ref 135–145)

## 2021-11-26 LAB — CBG MONITORING, ED: Glucose-Capillary: 100 mg/dL — ABNORMAL HIGH (ref 70–99)

## 2021-11-26 MED ORDER — KETOROLAC TROMETHAMINE 30 MG/ML IJ SOLN
INTRAMUSCULAR | Status: AC
  Filled 2021-11-26: qty 1

## 2021-11-26 MED ORDER — KETOROLAC TROMETHAMINE 30 MG/ML IJ SOLN
30.0000 mg | Freq: Once | INTRAMUSCULAR | Status: AC
  Administered 2021-11-26: 30 mg via INTRAMUSCULAR

## 2021-11-26 MED ORDER — IBUPROFEN 600 MG PO TABS: 600.0000 mg | ORAL_TABLET | Freq: Four times a day (QID) | ORAL | 0 refills | Status: AC | PRN

## 2021-11-26 MED ORDER — METHOCARBAMOL 500 MG PO TABS: 500.0000 mg | ORAL_TABLET | Freq: Two times a day (BID) | ORAL | 0 refills | Status: DC

## 2021-11-26 NOTE — ED Provider Notes (Signed)
?[facility] ? ? ? ?CSN: [id number] ?Arrival date & time: 11/26/21  1649 ? ? ?  ? ?History   ?Chief Complaint ?No chief complaint on file. ? ? ?HPI ?Alexandra Houston is a 61 y.o. female.  ? ?Patient presents to urgent care today for evaluation after she became nauseous, diaphoretic, and dizzy while helping a friend place labels on boxes yesterday.  This happened while she was standing doing light work.  She sat down as soon as she felt dizzy and her friend, who is a former Char[employer] fundraiser, gave her a piece of candy which helped her feel better after about 30 minutes.  She states that she went home soon after this happened and rested for the rest of the day.  This morning she woke up and developed a headache that is bilateral and radiates to the occipital region of her head, down her neck, and into her scapular region of her back.  She took a Goody powder at around 9 AM this morning with minimal relief of her head pain.  Currently rates head pain at a 7 on a scale of 0-10.  She has not taken any medication since this morning.  She denies nausea, dizziness, chest pain, heart palpitations and shortness of breath at this time.  She also denies experiencing heart palpitations at the time of the episode, but states it felt like "her bra was too tight and someone was squeezing her around her bra area".   ? ?She states that she drinks "about 1 bottle of water a day if that much" and she has been restricting her calories in order to lose weight.  She has lost 50 pounds over the last year and continues to be on a very restricted diet to 1500 cal at most per day.  She does not eat a well-balanced diet and had not eaten for a few hours at the time this episode happened.  Denies history of heart problems, diabetes, and hypertension.  She does not have a primary care provider and does not get annual wellness visits because she does not have insurance. ? ? ? ?History reviewed. No pertinent past medical history. ? ?There are no problems  to display for this patient. ? ? ?Past Surgical History:  ?Procedure Laterality Date  ? CESAREAN SECTION    ? HAND TENDON SURGERY    ? ? ?OB History   ?No obstetric history on file. ?  ? ? ? ?Home Medications   ? ?Prior to Admission medications   ?Medication Sig Start Date End Date Taking? Authorizing Provider  ?ibuprofen (ADVIL) 600 MG tablet Take 1 tablet (600 mg total) by mouth every 6 (six) hours as needed. 11/26/21  Yes Carlisle Beers, FNP  ?methocarbamol (ROBAXIN) 500 MG tablet Take 1 tablet (500 mg total) by mouth 2 (two) times daily. 11/26/21  Yes Carlisle Beers, FNP  ?albuterol (VENTOLIN HFA) 108 (90 Base) MCG/ACT inhaler Inhale 1-2 puffs into the lungs every 6 (six) hours as needed for wheezing or shortness of breath. ?Patient not taking: Reported on 11/26/2021 04/23/20   Dahlia Byes A, NP  ?benzonatate (TESSALON) 100 MG capsule Take 1 capsule (100 mg total) by mouth every 8 (eight) hours. ?Patient not taking: Reported on 11/26/2021 04/23/20   Dahlia Byes A, NP  ?naproxen (NAPROSYN) 375 MG tablet Take 1 tablet (375 mg total) by mouth 2 (two) times daily. ?Patient not taking: Reported on 11/26/2021 02/25/19   Merrilee Jansky, MD  ?tiZANidine (ZANAFLEX) 4 MG tablet  Take 1 tablet (4 mg total) by mouth every 6 (six) hours as needed for muscle spasms. ?Patient not taking: Reported on 11/26/2021 04/23/20   Dahlia Byes A, NP  ?dicyclomine (BENTYL) 20 MG tablet Take 1 tablet (20 mg total) by mouth 4 (four) times daily as needed for spasms. 05/05/18 02/25/19  Arthor Captain, PA-C  ? ? ?Family History ?History reviewed. No pertinent family history. ? ?Social History ?Social History  ? ?Tobacco Use  ? Smoking status: Former  ?  Years: 5.00  ?  Types: Cigarettes  ?  Quit date: 09/07/2013  ?  Years since quitting: 8.2  ? Smokeless tobacco: Former  ?Vaping Use  ? Vaping Use: Never used  ?Substance Use Topics  ? Alcohol use: Yes  ?  Alcohol/week: 0.0 standard drinks  ? Drug use: Never  ? ? ? ?Allergies    ?Penicillins ? ? ?Review of Systems ?Review of Systems  ?Constitutional:  Negative for chills and fever.  ?HENT:  Negative for ear pain and sore throat.   ?Eyes:  Negative for pain and visual disturbance.  ?Respiratory:  Negative for cough, chest tightness and shortness of breath.   ?Cardiovascular:  Negative for chest pain and palpitations.  ?Gastrointestinal:  Positive for nausea. Negative for abdominal distention, abdominal pain, blood in stool and vomiting.  ?Endocrine: Negative for polydipsia and polyphagia.  ?Genitourinary:  Negative for dysuria, frequency and hematuria.  ?Musculoskeletal:  Negative for arthralgias and back pain.  ?Skin:  Negative for color change and rash.  ?Neurological:  Positive for dizziness. Negative for seizures and syncope.  ?All other systems reviewed and are negative. ? ?Per HPI ?Physical Exam ?Triage Vital Signs ?ED Triage Vitals  ?Enc Vitals Group  ?   BP 11/26/21 1815 (!) 153/86  ?   Pulse Rate 11/26/21 1815 63  ?   Resp 11/26/21 1815 20  ?   Temp 11/26/21 1815 98.9 ?F (37.2 ?C)  ?   Temp Source 11/26/21 1815 Oral  ?   SpO2 11/26/21 1815 98 %  ?   Weight --   ?   Height --   ?   Head Circumference --   ?   Peak Flow --   ?   Pain Score 11/26/21 1812 7  ?   Pain Loc --   ?   Pain Edu? --   ?   Excl. in GC? --   ? ?No data found. ? ?Updated Vital Signs ?BP (!) 153/86 (BP Location: Right Arm)   Pulse 63   Temp 98.9 ?F (37.2 ?C) (Oral)   Resp 20   SpO2 98%  ? ?Visual Acuity ?Right Eye Distance:   ?Left Eye Distance:   ?Bilateral Distance:   ? ?Right Eye Near:   ?Left Eye Near:    ?Bilateral Near:    ? ?Physical Exam ?Vitals and nursing note reviewed.  ?Constitutional:   ?   General: She is not in acute distress. ?   Appearance: Normal appearance. She is well-developed. She is not ill-appearing or diaphoretic.  ?HENT:  ?   Head: Normocephalic and atraumatic.  ?   Right Ear: Tympanic membrane normal.  ?   Left Ear: Tympanic membrane normal.  ?   Nose: Nose normal.  ?    Mouth/Throat:  ?   Mouth: Mucous membranes are moist.  ?Eyes:  ?   Extraocular Movements: Extraocular movements intact.  ?   Conjunctiva/sclera: Conjunctivae normal.  ?   Pupils: Pupils are equal, round, and reactive to  light.  ?Neck:  ?   Comments: Decreased range of motion to neck due to suspected muscle strain to trapezius muscles bilaterally. ?Cardiovascular:  ?   Rate and Rhythm: Normal rate and regular rhythm.  ?   Pulses: Normal pulses.  ?   Heart sounds: Normal heart sounds. No murmur heard. ?  No friction rub. No gallop.  ?Pulmonary:  ?   Effort: Pulmonary effort is normal. No respiratory distress.  ?   Breath sounds: Normal breath sounds.  ?   Comments: Benign respiratory exam ?Abdominal:  ?   General: Bowel sounds are normal.  ?   Palpations: Abdomen is soft.  ?   Tenderness: There is no abdominal tenderness. There is no right CVA tenderness or left CVA tenderness.  ?Musculoskeletal:     ?   General: No swelling.  ?   Cervical back: Neck supple. Tenderness present. No rigidity.  ?Lymphadenopathy:  ?   Cervical: No cervical adenopathy.  ?Skin: ?   General: Skin is warm and dry.  ?   Capillary Refill: Capillary refill takes less than 2 seconds.  ?   Comments: Tenting noted when examining patient for dehydration.  ?Neurological:  ?   General: No focal deficit present.  ?   Mental Status: She is alert and oriented to person, place, and time.  ?   Sensory: Sensation is intact. No sensory deficit.  ?   Motor: Motor function is intact. No weakness.  ?   Coordination: Coordination is intact. Coordination normal.  ?   Gait: Gait normal.  ?   Comments: Normal strength and sensation bilaterally.  No concern for acute neurological abnormality.  ?Psychiatric:     ?   Mood and Affect: Mood normal.  ? ? ? ?UC Treatments / Results  ?Labs ?(all labs ordered are listed, but only abnormal results are displayed) ?Labs Reviewed  ?BASIC METABOLIC PANEL - Abnormal; Notable for the following components:  ?    Result Value  ?  Glucose, Bld 107 (*)   ? All other components within normal limits  ?CBC - Abnormal; Notable for the following components:  ? Hemoglobin 15.2 (*)   ? All other components within normal limits  ?CBG MONITORING, ED - Abnormal; Notable for

## 2021-11-26 NOTE — Discharge Instructions (Addendum)
You were seen today for evaluation after your episode of nausea, dizziness, and sweating yesterday.  Your EKG does not show any cardiac abnormality.  This is very reassuring. ? ?I suspect that your episode was related to hypoglycemia and or dehydration.  As discussed, please increase your water intake to at least 64 mL's per day.  Please attempt to purchase a 32 ounce water bottle and drink 32 ounces (1 water bottle) before 2 PM.  Then fill your water bottle back up and attempted to drink another water bottle (32 more ounces) before you go to bed.  You may notice your weight go up on the scale, but you will feel so much better and your health will greatly improve.  You can do it! ? ?Please take ibuprofen and prescribed Robaxin for your muscle strain in your neck.  Robaxin can make you sleepy so please only take this at night and do not drive or drink alcohol while taking this medication.  You cannot have another dose of ibuprofen or any other NSAID until tomorrow morning since you were given ketorolac (Toradol) in the clinic today.  Other NSAIDs include Aleve, ibuprofen, Motrin, Goody powder, aspirin, or naproxen. ? ?It was my pleasure to take care of you today. Please return if you have any new or worsening symptoms.  ? ? ?

## 2021-11-26 NOTE — ED Triage Notes (Signed)
Went to help a friend do some work on Monday.  Patient got dizzy, lightheaded.  Friend gave her candy and she felt better. Today had a slight headache that has worsened.  Headache is back of head, posterior neck and across shoulder blades, bilaterally.   ?

## 2021-12-14 ENCOUNTER — Ambulatory Visit (INDEPENDENT_AMBULATORY_CARE_PROVIDER_SITE_OTHER): Payer: Self-pay

## 2021-12-14 ENCOUNTER — Encounter (HOSPITAL_COMMUNITY): Payer: Self-pay | Admitting: Emergency Medicine

## 2021-12-14 ENCOUNTER — Ambulatory Visit (HOSPITAL_COMMUNITY)
Admission: EM | Admit: 2021-12-14 | Discharge: 2021-12-14 | Disposition: A | Discharge status: Home / Self Care | Payer: Self-pay | Attending: Internal Medicine | Admitting: Internal Medicine

## 2021-12-14 DIAGNOSIS — W19XXXA Unspecified fall, initial encounter: Secondary | ICD-10-CM

## 2021-12-14 DIAGNOSIS — M25462 Effusion, left knee: Secondary | ICD-10-CM

## 2021-12-14 DIAGNOSIS — M25562 Pain in left knee: Secondary | ICD-10-CM

## 2021-12-14 DIAGNOSIS — M79641 Pain in right hand: Secondary | ICD-10-CM

## 2021-12-14 MED ORDER — METHOCARBAMOL 500 MG PO TABS: 500.0000 mg | ORAL_TABLET | Freq: Two times a day (BID) | ORAL | 0 refills | Status: DC

## 2021-12-14 MED ORDER — KETOROLAC TROMETHAMINE 60 MG/2ML IM SOLN
INTRAMUSCULAR | Status: AC
  Filled 2021-12-14: qty 2

## 2021-12-14 MED ORDER — NAPROXEN 500 MG PO TABS: 500.0000 mg | ORAL_TABLET | Freq: Two times a day (BID) | ORAL | 0 refills | Status: DC

## 2021-12-14 MED ORDER — KETOROLAC TROMETHAMINE 60 MG/2ML IM SOLN
60.0000 mg | Freq: Once | INTRAMUSCULAR | Status: AC
  Administered 2021-12-14: 60 mg via INTRAMUSCULAR

## 2021-12-14 NOTE — ED Triage Notes (Signed)
Fall today. States she was carrying birdseed and somehow her foot came out from underneath her. Unsure of exactly why she fell. Denies losing consciousness, use of blood thinners, or hitting head. Landed on her right side and right flank hit the threshold of the door. C/o pain, worse with breathing/movement, to right lower ribcage/flank area. Bruising visible to right ribcage. Requesting to be seen by Kelby Aline. NP ?

## 2021-12-14 NOTE — ED Notes (Signed)
Patient declined knee brace and requested Ace Wrap. Notified provider ?

## 2021-12-14 NOTE — ED Provider Notes (Signed)
[facility]    CSN: [id number] Arrival date & time: 12/14/21  1311      History   Chief Complaint Chief Complaint  Patient presents with   Fall    HPI Alexandra Houston is a 61 y.o. female.   Patient presents urgent care for evaluation after she fell this morning around 10:30 AM while attempting to walk down her stairs when her left foot slipped out from underneath her and she fell backwards onto her right side.  Her right side hit the top step of her porch and she states that her body twisted prior to falling.  Her main complaint is pain to her right side and she is unable to take a full breath without pain to her right rib cage.  While at urgent care, she developed left knee pain and states that it hurts to stand on her left knee.  She denies pain to her hips/neck/right knee, denies hitting her head, denies LOC, and denies blood thinner use.  She has not taken any medication for the pain.  Denies dizziness, lightheadedness, headache, and nausea.  Denies any other aggravating or relieving factors.   Fall   History reviewed. No pertinent past medical history.  There are no problems to display for this patient.   Past Surgical History:  Procedure Laterality Date   CESAREAN SECTION     HAND TENDON SURGERY      OB History   No obstetric history on file.      Home Medications    Prior to Admission medications   Medication Sig Start Date End Date Taking? Authorizing Provider  methocarbamol (ROBAXIN) 500 MG tablet Take 1 tablet (500 mg total) by mouth 2 (two) times daily. 12/14/21  Yes Carlisle Beers, FNP  naproxen (NAPROSYN) 500 MG tablet Take 1 tablet (500 mg total) by mouth 2 (two) times daily. 12/14/21  Yes Carlisle Beers, FNP  albuterol (VENTOLIN HFA) 108 (90 Base) MCG/ACT inhaler Inhale 1-2 puffs into the lungs every 6 (six) hours as needed for wheezing or shortness of breath. Patient not taking: Reported on 11/26/2021 04/23/20   Dahlia Byes A, NP   benzonatate (TESSALON) 100 MG capsule Take 1 capsule (100 mg total) by mouth every 8 (eight) hours. Patient not taking: Reported on 11/26/2021 04/23/20   Dahlia Byes A, NP  ibuprofen (ADVIL) 600 MG tablet Take 1 tablet (600 mg total) by mouth every 6 (six) hours as needed. 11/26/21   Carlisle Beers, FNP  tiZANidine (ZANAFLEX) 4 MG tablet Take 1 tablet (4 mg total) by mouth every 6 (six) hours as needed for muscle spasms. Patient not taking: Reported on 11/26/2021 04/23/20   Dahlia Byes A, NP  dicyclomine (BENTYL) 20 MG tablet Take 1 tablet (20 mg total) by mouth 4 (four) times daily as needed for spasms. 05/05/18 02/25/19  Arthor Captain, PA-C    Family History History reviewed. No pertinent family history.  Social History Social History   Tobacco Use   Smoking status: Former    Years: 5.00    Types: Cigarettes    Quit date: 09/07/2013    Years since quitting: 8.2   Smokeless tobacco: Former  Building services engineer Use: Never used  Substance Use Topics   Alcohol use: Yes    Alcohol/week: 0.0 standard drinks   Drug use: Never     Allergies   Penicillins   Review of Systems Review of Systems Per HPI  Physical Exam Triage Vital Signs ED Triage  Vitals [12/14/21 1436]  Enc Vitals Group     BP (!) 161/89     Pulse Rate 62     Resp 16     Temp 98.4 F (36.9 C)     Temp Source Oral     SpO2 95 %     Weight      Height      Head Circumference      Peak Flow      Pain Score 10     Pain Loc      Pain Edu?      Excl. in GC?    No data found.  Updated Vital Signs BP (!) 161/89 (BP Location: Left Arm)   Pulse 62   Temp 98.4 F (36.9 C) (Oral)   Resp 16   SpO2 95%   Visual Acuity Right Eye Distance:   Left Eye Distance:   Bilateral Distance:    Right Eye Near:   Left Eye Near:    Bilateral Near:     Physical Exam Vitals and nursing note reviewed.  Constitutional:      General: She is not in acute distress.    Appearance: Normal appearance. She is  well-developed. She is not ill-appearing.  HENT:     Head: Normocephalic and atraumatic.     Right Ear: Tympanic membrane, ear canal and external ear normal.     Left Ear: Tympanic membrane, ear canal and external ear normal.     Nose: Nose normal.     Mouth/Throat:     Mouth: Mucous membranes are moist.  Eyes:     Extraocular Movements: Extraocular movements intact.     Conjunctiva/sclera: Conjunctivae normal.  Cardiovascular:     Rate and Rhythm: Normal rate and regular rhythm.     Heart sounds: Normal heart sounds. No murmur heard.   No friction rub. No gallop.  Pulmonary:     Effort: Pulmonary effort is normal. No respiratory distress.     Breath sounds: Normal breath sounds. No wheezing, rhonchi or rales.  Chest:     Chest wall: No tenderness.  Abdominal:     Palpations: Abdomen is soft.     Tenderness: There is no abdominal tenderness. There is no right CVA tenderness or left CVA tenderness.  Musculoskeletal:        General: No swelling.     Cervical back: Normal and neck supple.     Thoracic back: Normal. No swelling, deformity, signs of trauma, lacerations, tenderness or bony tenderness.     Lumbar back: Normal. No swelling or lacerations.       Back:     Right knee: Normal.     Comments: No bony or paraspinal tenderness to her cervical, thoracic, and lumbar spine.  Patient does report small amount of tenderness to palpation to her right flank area near her oblique muscle that radiates to her right axilla.  No muscle spasm palpated on exam.  Pain is reproducible to palpation.  Patient has limited range of motion with forward, backward, and lateral flexion/extension to her hips.  Patient is neurologically and vascularly intact.  Area of greatest tenderness shown on diagram.  No ecchymosis noted.  Where she fell onto her where she fell onto her  Left knee is tender to palpation to posterior ligament as well as the medial collateral ligament with valgus stress.  No tenderness to  the patella.  No effusion or swelling appreciated on exam.  Range of motion is slightly decreased due to  tenderness with extension of left knee.  Patient has full range of motion with flexion to left knee.   Skin:    General: Skin is warm and dry.     Capillary Refill: Capillary refill takes less than 2 seconds.     Findings: No rash.  Neurological:     General: No focal deficit present.     Mental Status: She is alert and oriented to person, place, and time.  Psychiatric:        Mood and Affect: Mood normal.        Behavior: Behavior normal.        Thought Content: Thought content normal.        Judgment: Judgment normal.     UC Treatments / Results  Labs (all labs ordered are listed, but only abnormal results are displayed) Labs Reviewed - No data to display  EKG   Radiology DG Ribs Unilateral W/Chest Right  Result Date: 12/14/2021 CLINICAL DATA:  Fall today landing on right side. EXAM: RIGHT RIBS AND CHEST - 3+ VIEW COMPARISON:  Chest radiograph 04/23/2020 FINDINGS: No fracture or other bone lesions are seen involving the ribs. There is no evidence of pneumothorax or pleural effusion. Both lungs are clear. Heart size and mediastinal contours are within normal limits. IMPRESSION: No acute right rib fracture or pulmonary complication. Electronically Signed   By: Narda Rutherford M.D.   On: 12/14/2021 15:35   DG Knee Complete 4 Views Left  Result Date: 12/14/2021 CLINICAL DATA:  Acute LEFT knee pain following fall today. Initial encounter. EXAM: LEFT KNEE - COMPLETE 4+ VIEW COMPARISON:  None. FINDINGS: There is no evidence of acute fracture, subluxation or dislocation. A knee effusion is present. The joint spaces are otherwise unremarkable. No focal bony lesions are present. IMPRESSION: Knee effusion without acute bony abnormality. Electronically Signed   By: Harmon Pier M.D.   On: 12/14/2021 16:38    Procedures Procedures (including critical care time)  Medications Ordered in  UC Medications  ketorolac (TORADOL) injection 60 mg (60 mg Intramuscular Given 12/14/21 1559)    Initial Impression / Assessment and Plan / UC Course  I have reviewed the triage vital signs and the nursing notes.  Pertinent labs & imaging results that were available during my care of the patient were reviewed by me and considered in my medical decision making (see chart for details).  Patient presents to urgent care after her fall this morning around 10:30 AM where she fell onto her right side. She is now also reporting left knee pain in the clinic. She was able to ambulate into the clinic, but is now having difficulty standing on left leg. When she fell, she did report a "twisting" motion and there is concern for a meniscal injury. Also reporting "pulling" behind her knee. Left knee x-ray obtained to rule out acute bony abnormality to knee. X-ray was negative for acute bony abnormality, but did show knee effusion. Knee pain is likely due to muscle strain due to "pulling" nature. Applied ice in clinic today and ACE wrap to provide support and decrease swelling. Patient would rather purchase a knee brace at [facility] than have one applied here to which I agreed.  Pain to right ribcage and pleuritic chest pain to the right side with inspiration concerning for acute thoracic abnormality due to fall. Rib x-ray with right chest view showed no acute rib fracture or pulmonary complication. Patient's pain treated with 60mg  ketorolac IM injection in clinic today. Last BMP done  on November 26, 2021 showed normal kidney function. Plan to treat patient with naproxen 500mg  BID for the next 5 days for pain and inflammation. Patient instructed to take this medication with food and to avoid other NSAIDS while on this medication. Also prescribed muscle relaxer for muscle spasm associated with fall and potential muscle strain to right oblique muscle/muscles to left knee. Instructed patient not to take this medication and drink  alcohol/drive. Counseled patient on appropriate use of medications prescribed and recommended as well as potential adverse effects.   Patient instructed to return to urgent care if she develops any new or worsening symptoms. Instructed to go to the ED if her symptoms are severe. If symptoms do not improve with supportive care treatment provided today, patient to follow-up with sports medicine after 2 weeks for further evaluation. Patient verbalizes understanding and agrees with plan. All questions answered at this time.    Final Clinical Impressions(s) / UC Diagnoses   Final diagnoses:  Fall, initial encounter  Knee effusion, left     Discharge Instructions      You were seen today after a fall.  Your x-ray of your chest and your ribs is normal.  Your x-ray of your left knee is also normal.  This is very reassuring.  It is likely going to take some time for your body to heal from the shock of this fall.  This will require use of antiinflammatory medications as well as some muscle relaxer.   Please start taking the naproxen that I have prescribed tomorrow.  Do not take any of it tonight because I gave you an injection of a strong NSAID in the clinic today to reduce inflammation and pain.  Please take the naproxen twice daily for the next 5 days with food.  After 5 days, you may take this as needed for pain and inflammation.  I have also prescribed Robaxin muscle relaxer.  This medication may make you sleepy, so please do not drive or drink alcohol while taking this.  It is recommended that you apply ice to your injured areas for the first 48 hours after injury then switch over to heat.  Please wear your knee brace that I have provided you for the next week to reduce swelling to your left knee and provide support.    If you experience any new or worsening symptoms in the next 2 weeks, please return to urgent care.  If your symptoms are severe, please go to the emergency room. If you are still  experiencing significant pain to your right side or your left knee after 2 weeks, please call the sports medicine clinic for an appointment for further evaluation and management of this pain.  I have given you their phone number on your paperwork today.  It was my pleasure taking care of you again and we are always here if you need [place]! GREAT JOB increasing your water intake!! Keep up the great work!      ED Prescriptions     Medication Sig Dispense Auth. Provider   naproxen (NAPROSYN) 500 MG tablet Take 1 tablet (500 mg total) by mouth 2 (two) times daily. 30 tablet Carlisle Beers, FNP   methocarbamol (ROBAXIN) 500 MG tablet Take 1 tablet (500 mg total) by mouth 2 (two) times daily. 20 tablet Carlisle Beers, FNP      PDMP not reviewed this encounter.   Carlisle Beers, [place] 12/15/21 2143

## 2021-12-14 NOTE — Discharge Instructions (Addendum)
You were seen today after a fall.  Your x-ray of your chest and your ribs is normal.  Your x-ray of your left knee is also normal.  This is very reassuring.  It is likely going to take some time for your body to heal from the shock of this fall.  This will require use of antiinflammatory medications as well as some muscle relaxer.  ? ?Please start taking the naproxen that I have prescribed tomorrow.  Do not take any of it tonight because I gave you an injection of a strong NSAID in the clinic today to reduce inflammation and pain.  Please take the naproxen twice daily for the next 5 days with food.  After 5 days, you may take this as needed for pain and inflammation.  I have also prescribed Robaxin muscle relaxer.  This medication may make you sleepy, so please do not drive or drink alcohol while taking this. ? ?It is recommended that you apply ice to your injured areas for the first 48 hours after injury then switch over to heat.  Please wear your knee brace that I have provided you for the next week to reduce swelling to your left knee and provide support.   ? ?If you experience any new or worsening symptoms in the next 2 weeks, please return to urgent care.  If your symptoms are severe, please go to the emergency room. If you are still experiencing significant pain to your right side or your left knee after 2 weeks, please call the sports medicine clinic for an appointment for further evaluation and management of this pain.  I have given you their phone number on your paperwork today. ? ?It was my pleasure taking care of you again and we are always here if you need [place]! GREAT JOB increasing your water intake!! Keep up the great work!  ?

## 2022-09-07 IMAGING — DX DG KNEE COMPLETE 4+V*L*
4 series · 4 of 4 positions shown · non-contrast
Comparison: None.

CLINICAL DATA: Acute LEFT knee pain following fall today. Initial
encounter.

EXAM:
LEFT KNEE - COMPLETE 4+ VIEW

[knee ap]
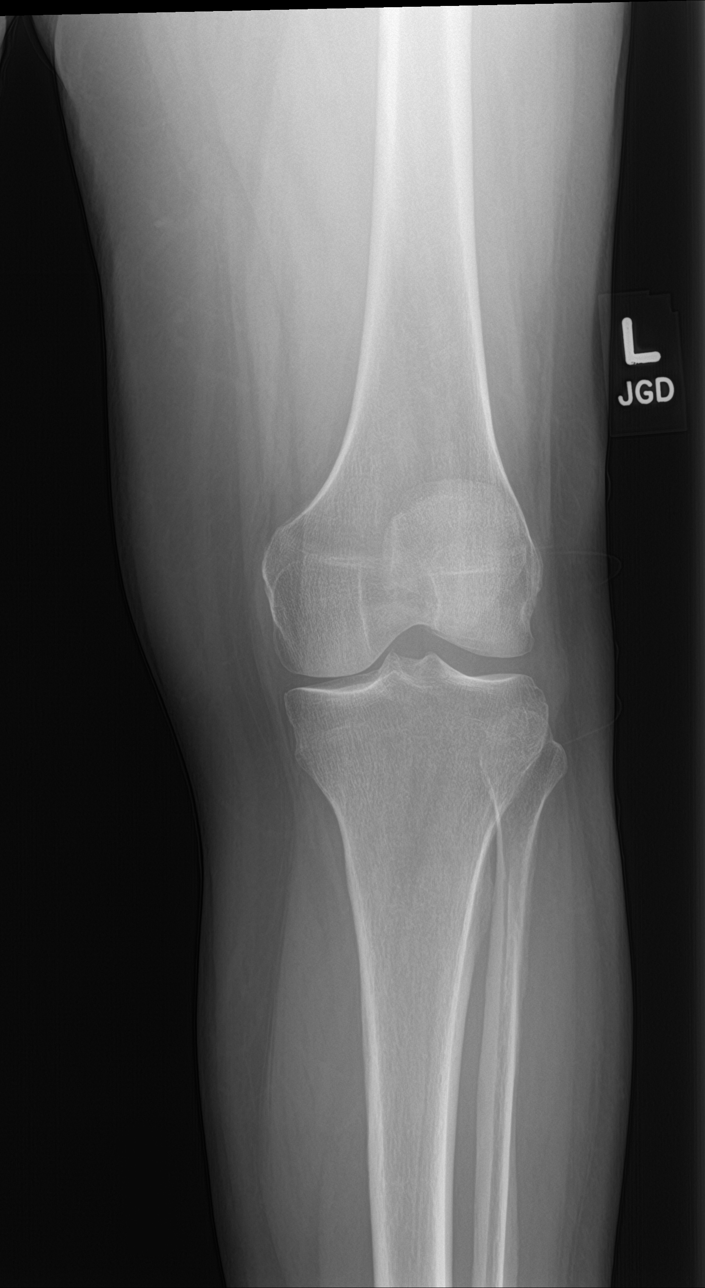

[knee obl (1 of 2)]
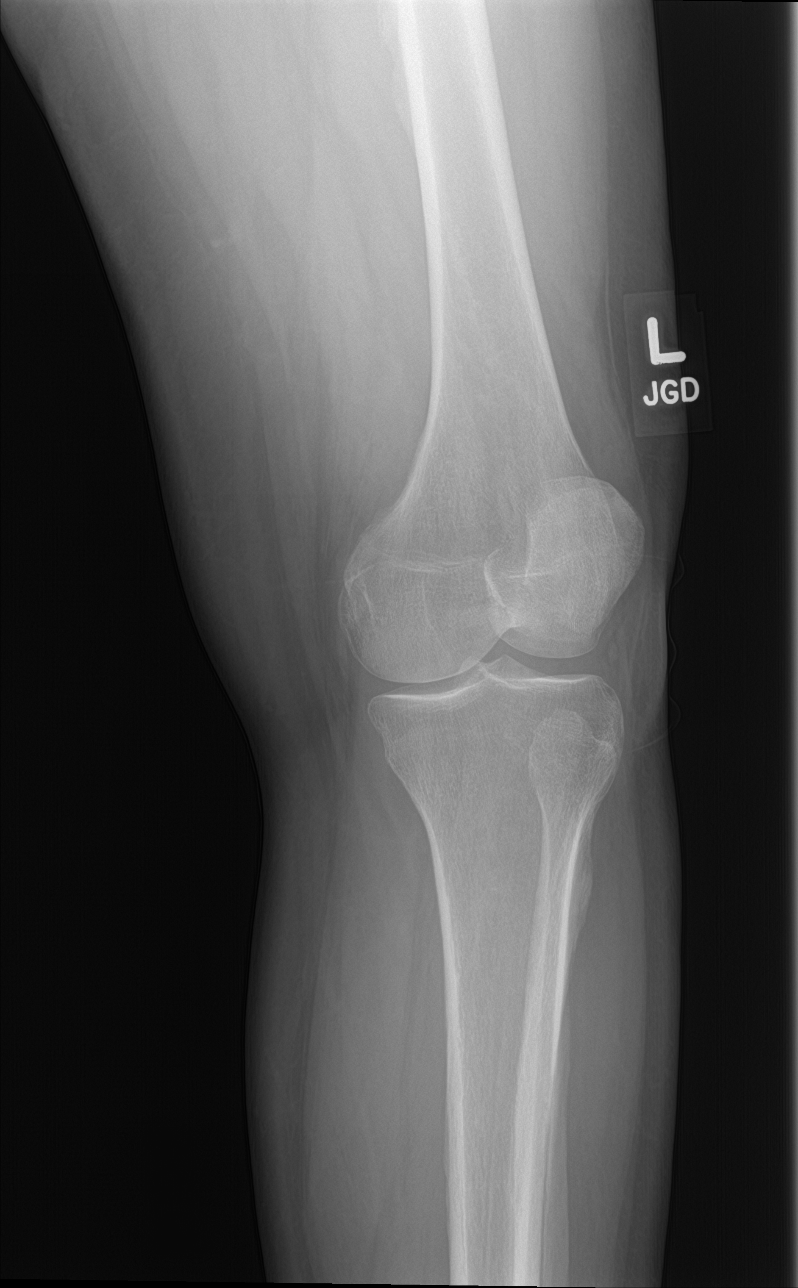

[knee obl (2 of 2)]
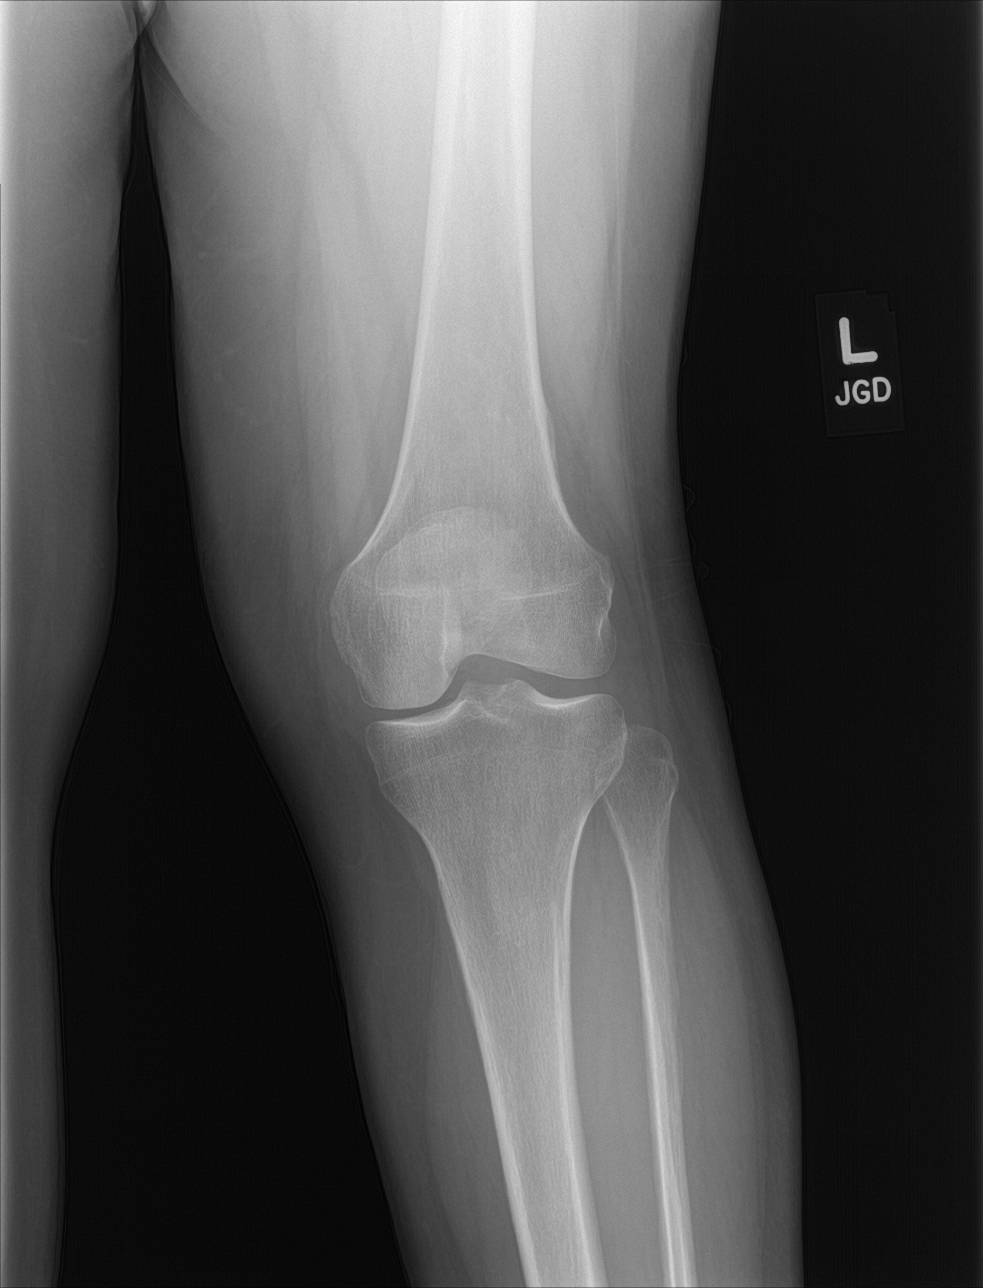

[knee lat]
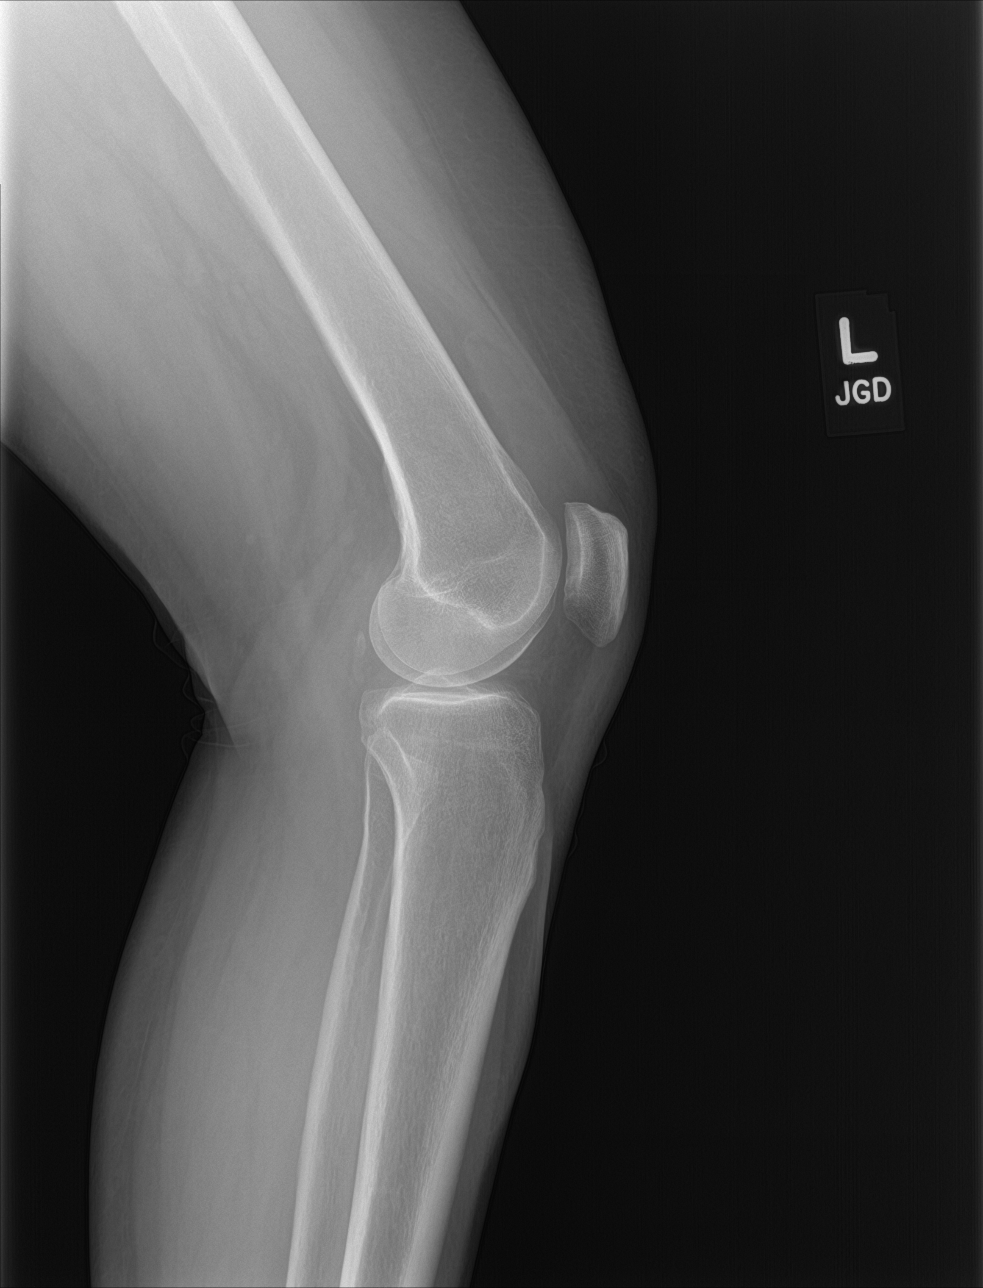

[4 of 4 positions shown; findings below may reference images not displayed]

FINDINGS: There is no evidence of acute fracture, subluxation or dislocation.

A knee effusion is present.

The joint spaces are otherwise unremarkable.

No focal bony lesions are present.
IMPRESSION: Knee effusion without acute bony abnormality.

## 2023-02-26 ENCOUNTER — Ambulatory Visit (INDEPENDENT_AMBULATORY_CARE_PROVIDER_SITE_OTHER): Payer: Self-pay

## 2023-02-26 ENCOUNTER — Ambulatory Visit (HOSPITAL_COMMUNITY)
Admission: EM | Admit: 2023-02-26 | Discharge: 2023-02-26 | Disposition: A | Discharge status: Home / Self Care | Payer: Self-pay | Attending: Emergency Medicine | Admitting: Emergency Medicine

## 2023-02-26 ENCOUNTER — Encounter (HOSPITAL_COMMUNITY): Payer: Self-pay | Admitting: *Deleted

## 2023-02-26 DIAGNOSIS — M79671 Pain in right foot: Secondary | ICD-10-CM

## 2023-02-26 MED ORDER — NAPROXEN 500 MG PO TABS: 500.0000 mg | ORAL_TABLET | Freq: Two times a day (BID) | ORAL | 0 refills | Status: AC

## 2023-02-26 MED ORDER — CYCLOBENZAPRINE HCL 10 MG PO TABS: 10.0000 mg | ORAL_TABLET | Freq: Every day | ORAL | 0 refills | Status: DC

## 2023-02-26 NOTE — ED Triage Notes (Signed)
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 She can't sleep, states she is in a lot of pain. She hasn't taken any meds, she did ice and elevate her right leg.

## 2023-02-26 NOTE — ED Provider Notes (Signed)
[facility]    CSN: [id number] Arrival date & time: 02/26/23  1638      History   Chief Complaint Chief Complaint  Patient presents with   Foot Injury    HPI Alexandra Houston is a 62 y.o. female.    Patient presents for evaluation of right foot pain beginning 1 day ago after injury.  Was grooming her dog when the 40 pound table slipped causing the age to hit the midfoot.  Bruising present.  Unable to fully bear weight.  Symptoms exacerbated by extension.  Pain interfering with sleep and the weight of the sheet causing immense pain.  Has not attempted treatment.  Denies numbness or tingling.    History reviewed. No pertinent past medical history.  There are no problems to display for this patient.   Past Surgical History:  Procedure Laterality Date   CESAREAN SECTION     HAND TENDON SURGERY      OB History   No obstetric history on file.      Home Medications    Prior to Admission medications   Medication Sig Start Date End Date Taking? Authorizing Provider  albuterol (VENTOLIN HFA) 108 (90 Base) MCG/ACT inhaler Inhale 1-2 puffs into the lungs every 6 (six) hours as needed for wheezing or shortness of breath. Patient not taking: Reported on 11/26/2021 04/23/20   Dahlia Byes A, NP  benzonatate (TESSALON) 100 MG capsule Take 1 capsule (100 mg total) by mouth every 8 (eight) hours. Patient not taking: Reported on 11/26/2021 04/23/20   Dahlia Byes A, NP  ibuprofen (ADVIL) 600 MG tablet Take 1 tablet (600 mg total) by mouth every 6 (six) hours as needed. 11/26/21   Carlisle Beers, FNP  methocarbamol (ROBAXIN) 500 MG tablet Take 1 tablet (500 mg total) by mouth 2 (two) times daily. 12/14/21   Carlisle Beers, FNP  naproxen (NAPROSYN) 500 MG tablet Take 1 tablet (500 mg total) by mouth 2 (two) times daily. 12/14/21   Carlisle Beers, FNP  tiZANidine (ZANAFLEX) 4 MG tablet Take 1 tablet (4 mg total) by mouth every 6 (six) hours as needed for muscle  spasms. Patient not taking: Reported on 11/26/2021 04/23/20   Dahlia Byes A, NP  dicyclomine (BENTYL) 20 MG tablet Take 1 tablet (20 mg total) by mouth 4 (four) times daily as needed for spasms. 05/05/18 02/25/19  Arthor Captain, PA-C    Family History History reviewed. No pertinent family history.  Social History Social History   Tobacco Use   Smoking status: Former    Current packs/day: 0.00    Types: Cigarettes    Start date: 09/07/2008    Quit date: 09/07/2013    Years since quitting: 9.4   Smokeless tobacco: Former  Building services engineer status: Never Used  Substance Use Topics   Alcohol use: Yes    Alcohol/week: 0.0 standard drinks of alcohol   Drug use: Never     Allergies   Penicillins   Review of Systems Review of Systems   Physical Exam Triage Vital Signs ED Triage Vitals  Encounter Vitals Group     BP 02/26/23 1710 (!) 169/103     Systolic BP Percentile --      Diastolic BP Percentile --      Pulse Rate 02/26/23 1710 97     Resp 02/26/23 1710 18     Temp 02/26/23 1710 99.3 F (37.4 C)     Temp Source 02/26/23 1710 Oral  SpO2 02/26/23 1710 96 %     Weight --      Height --      Head Circumference --      Peak Flow --      Pain Score 02/26/23 1709 9     Pain Loc --      Pain Education --      Exclude from Growth Chart --    No data found.  Updated Vital Signs BP (!) 169/103 (BP Location: Left Arm)   Pulse 97   Temp 99.3 F (37.4 C) (Oral)   Resp 18   SpO2 96%   Visual Acuity Right Eye Distance:   Left Eye Distance:   Bilateral Distance:    Right Eye Near:   Left Eye Near:    Bilateral Near:     Physical Exam Constitutional:      Appearance: Normal appearance.  Eyes:     Extraocular Movements: Extraocular movements intact.  Pulmonary:     Effort: Pulmonary effort is normal.  Musculoskeletal:     Comments: Tenderness and mild to moderate swelling present to the dorsum aspect of the midfoot Slight bruise extended across the  midfoot and the lateral aspect of the right ankle, unable to bear weight, able to complete flexion and extension of the ankle the pain is elicited, 2+ dorsalis and pedal pulse, sensation intact.  Neurological:     Mental Status: She is alert and oriented to person, place, and time. Mental status is at baseline.      UC Treatments / Results  Labs (all labs ordered are listed, but only abnormal results are displayed) Labs Reviewed - No data to display  EKG   Radiology DG Foot Complete Right  Result Date: 02/26/2023 CLINICAL DATA:  Right foot pain after injury. EXAM: RIGHT FOOT COMPLETE - 3+ VIEW COMPARISON:  March 07, 2015. FINDINGS: There is no evidence of fracture or dislocation. Mild degenerative changes seen involving the fifth proximal interphalangeal joint. Soft tissues are unremarkable. IMPRESSION: No acute abnormality seen. Electronically Signed   By: Lupita Raider M.D.   On: 02/26/2023 17:27    Procedures Procedures (including critical care time)  Medications Ordered in UC Medications - No data to display  Initial Impression / Assessment and Plan / UC Course  I have reviewed the triage vital signs and the nursing notes.  Pertinent labs & imaging results that were available during my care of the patient were reviewed by me and considered in my medical decision making (see chart for details).  Acute right foot pain  X-ray negative, discussed findings with patient, prescribed naproxen and Flexeril, discussed administration, recommend ice and heat over the affected area, elevation and activity as tolerated, difficulty bearing weight, recommended heel walking, tolerable, has crutches at home, may use for stability and support, recommended follow-up if symptoms are still present in 2 weeks Final Clinical Impressions(s) / UC Diagnoses   Final diagnoses:  None   Discharge Instructions   None    ED Prescriptions   None    PDMP not reviewed this encounter.   Valinda Hoar, NP 02/26/23 1914

## 2023-02-26 NOTE — Discharge Instructions (Signed)
Today you were evaluated for right foot pain  X-ray shows that there is no injury to the bone  Pain is coming from injury to the tissue which should improve with time  Take naproxen every morning and every evening for at least 5 days, use consistently, this helps to reduce inflammation which can help with your pain, you may take Tylenol in addition to this to help to strengthen  You may use muscle relaxant at bedtime for comfort and to allow you to rest  For the next 24 hours you may use ice over the affected area in 10 to 15-minute intervals then you may switch over to heat  Elevate whenever sitting and lying to add reduce swelling and for additional comfort.  Until you are able to put full weight onto the foot walk on the back of your heel for stability and use crutches  If you continue to have pain and are unable to fully put weight onto the foot in 2 weeks please follow-up for reevaluation With

## 2023-09-25 ENCOUNTER — Emergency Department (HOSPITAL_COMMUNITY)
Admission: EM | Admit: 2023-09-25 | Discharge: 2023-09-26 | Disposition: A | Discharge status: Home / Self Care | Payer: Self-pay | Attending: Emergency Medicine | Admitting: Emergency Medicine

## 2023-09-25 ENCOUNTER — Other Ambulatory Visit: Payer: Self-pay

## 2023-09-25 ENCOUNTER — Emergency Department (HOSPITAL_COMMUNITY): Payer: Self-pay

## 2023-09-25 DIAGNOSIS — G44209 Tension-type headache, unspecified, not intractable: Secondary | ICD-10-CM | POA: Insufficient documentation

## 2023-09-25 DIAGNOSIS — Z20822 Contact with and (suspected) exposure to covid-19: Secondary | ICD-10-CM | POA: Insufficient documentation

## 2023-09-25 DIAGNOSIS — X58XXXA Exposure to other specified factors, initial encounter: Secondary | ICD-10-CM | POA: Insufficient documentation

## 2023-09-25 DIAGNOSIS — S29012A Strain of muscle and tendon of back wall of thorax, initial encounter: Secondary | ICD-10-CM | POA: Insufficient documentation

## 2023-09-25 DIAGNOSIS — R0789 Other chest pain: Secondary | ICD-10-CM | POA: Insufficient documentation

## 2023-09-25 DIAGNOSIS — M542 Cervicalgia: Secondary | ICD-10-CM | POA: Insufficient documentation

## 2023-09-25 LAB — BASIC METABOLIC PANEL
Anion gap: 14 (ref 5–15)
BUN: 9 mg/dL (ref 8–23)
CO2: 20 mmol/L — ABNORMAL LOW (ref 22–32)
Calcium: 9.1 mg/dL (ref 8.9–10.3)
Chloride: 106 mmol/L (ref 98–111)
Creatinine, Ser: 0.73 mg/dL (ref 0.44–1.00)
GFR, Estimated: >60 mL/min (ref >60)
Glucose, Bld: 101 mg/dL — ABNORMAL HIGH (ref 70–99)
Potassium: 3.4 mmol/L — ABNORMAL LOW (ref 3.5–5.1)
Sodium: 140 mmol/L (ref 135–145)

## 2023-09-25 LAB — CBC
HCT: 43.2 % (ref 36.0–46.0)
Hemoglobin: 14.6 g/dL (ref 12.0–15.0)
MCH: 29.9 pg (ref 26.0–34.0)
MCHC: 33.8 g/dL (ref 30.0–36.0)
MCV: 88.3 fL (ref 80.0–100.0)
Platelets: 189 10*3/uL (ref 150–400)
RBC: 4.89 MIL/uL (ref 3.87–5.11)
RDW: 12.6 % (ref 11.5–15.5)
WBC: 8.5 10*3/uL (ref 4.0–10.5)
nRBC: 0 % (ref 0.0–0.2)

## 2023-09-25 LAB — TROPONIN I (HIGH SENSITIVITY)
Troponin I (High Sensitivity): 3 ng/L (ref <18)
Troponin I (High Sensitivity): 4 ng/L (ref <18)

## 2023-09-25 MED ORDER — KETOROLAC TROMETHAMINE 15 MG/ML IJ SOLN
15.0000 mg | Freq: Once | INTRAMUSCULAR | Status: AC
  Administered 2023-09-25: 15 mg via INTRAVENOUS
  Filled 2023-09-25: qty 1

## 2023-09-25 MED ORDER — PROCHLORPERAZINE EDISYLATE 10 MG/2ML IJ SOLN
5.0000 mg | Freq: Once | INTRAMUSCULAR | Status: AC
  Administered 2023-09-25: 5 mg via INTRAVENOUS
  Filled 2023-09-25: qty 2

## 2023-09-25 MED ORDER — METHOCARBAMOL 500 MG PO TABS
750.0000 mg | ORAL_TABLET | Freq: Once | ORAL | Status: AC
  Administered 2023-09-25: 750 mg via ORAL
  Filled 2023-09-25: qty 2

## 2023-09-25 MED ORDER — METHOCARBAMOL 500 MG PO TABS: 500.0000 mg | ORAL_TABLET | Freq: Two times a day (BID) | ORAL | 0 refills | Status: AC

## 2023-09-25 MED ORDER — ALUM & MAG HYDROXIDE-SIMETH 200-200-20 MG/5ML PO SUSP
30.0000 mL | Freq: Once | ORAL | Status: AC
  Administered 2023-09-25: 30 mL via ORAL
  Filled 2023-09-25: qty 30

## 2023-09-25 NOTE — ED Triage Notes (Signed)
 Patient from home for eval of anxiety today. Patient hyperventilating and shaking, having associated chest heaviness and arm tingling since 0830 today. EKG WDL per EMS. Also reports headache for several days. Does not go to the doctor or take any medication because she does not believe in that sort of thing. Patient took 325 ASA prior to EMS arrival and took two excedrin migraine tablets.

## 2023-09-25 NOTE — ED Provider Triage Note (Addendum)
 Emergency Medicine Provider Triage Evaluation Note  Keita Demarco , a 63 y.o. female  was evaluated in triage.  Pt complains of HA x days, CP since this morning about 0830. Numbness down L arm. No Hx of clot or ACS. No thinners  Review of Systems  Positive: Chills, HA (new onset), nausea, neck tightness Negative: Vision changes, tinnitus, vomiting, diarrhea, abd pain  Physical Exam  BP (!) 154/103 (BP Location: Right Arm)   Pulse 94   Temp 98.3 F (36.8 C) (Oral)   Resp 20   Ht 5' 6 (1.676 m)   Wt 81.6 kg   SpO2 100%   BMI 29.05 kg/m  Gen:   Awake, no distress   Resp:  Normal effort  MSK:   Moves extremities without difficulty  Other:  Equal bilat grip strength  Medical Decision Making  Medically screening exam initiated at 12:37 PM.  Appropriate orders placed.  Sukaina Toothaker was informed that the remainder of the evaluation will be completed by another provider, this initial triage assessment does not replace that evaluation, and the importance of remaining in the ED until their evaluation is complete.  Labs and imaging ordered   Francis Ileana LOISE DEVONNA 09/25/23 1241    Francis Ileana LOISE, PA-C 09/25/23 1241

## 2023-09-25 NOTE — ED Provider Notes (Signed)
 [facility] Provider Note  CSN: [id number] Arrival date & time: 09/25/23 1218  Chief Complaint(s) Anxiety and Chest Pain  HPI Alexandra Houston is a 63 y.o. female with a past medical history listed below here for 3 days of generalized headache mostly in the occipital region with muscle tension in the neck, upper shoulder and back region.  She also reports squeezing around her bra line and waking up with chest discomfort this morning.  She endorses nausea without emesis.  No shortness of breath.  She denies any fevers or chills.  No coughing or congestion.  Patient has been taken over-the-counter headache medicine without relief.  Patient has had similar episodes in the past and seen by urgent care. Treated for muscle strain.  The history is provided by the patient.    Past Medical History No past medical history on file. There are no active problems to display for this patient.  Home Medication(s) Prior to Admission medications   Medication Sig Start Date End Date Taking? Authorizing Provider  albuterol  (VENTOLIN  HFA) 108 (90 Base) MCG/ACT inhaler Inhale 1-2 puffs into the lungs every 6 (six) hours as needed for wheezing or shortness of breath. Patient not taking: Reported on 11/26/2021 04/23/20   Adah Corning A, FNP  benzonatate  (TESSALON ) 100 MG capsule Take 1 capsule (100 mg total) by mouth every 8 (eight) hours. Patient not taking: Reported on 11/26/2021 04/23/20   Adah Corning A, FNP  ibuprofen  (ADVIL ) 600 MG tablet Take 1 tablet (600 mg total) by mouth every 6 (six) hours as needed. 11/26/21   Enedelia Dorna HERO, FNP  methocarbamol  (ROBAXIN ) 500 MG tablet Take 1 tablet (500 mg total) by mouth 2 (two) times daily. 09/25/23   Trine Raynell Moder, MD  naproxen  (NAPROSYN ) 500 MG tablet Take 1 tablet (500 mg total) by mouth 2 (two) times daily. 02/26/23   White, Shelba SAUNDERS, NP  tiZANidine  (ZANAFLEX ) 4 MG tablet Take 1 tablet (4 mg total) by mouth  every 6 (six) hours as needed for muscle spasms. Patient not taking: Reported on 11/26/2021 04/23/20   Adah Corning A, FNP  dicyclomine  (BENTYL ) 20 MG tablet Take 1 tablet (20 mg total) by mouth 4 (four) times daily as needed for spasms. 05/05/18 02/25/19  Harris, Abigail, PA-C                                                                                                                                    Allergies Penicillins  Review of Systems Review of Systems As noted in HPI  Physical Exam Vital Signs  I have reviewed the triage vital signs BP (!) 166/93 (BP Location: Right Arm)   Pulse 84   Temp 98.7 F (37.1 C) (Oral)   Resp 12   Ht 5' 6 (1.676 m)   Wt 81.6 kg   SpO2 99%   BMI 29.05 kg/m   Physical Exam Vitals  reviewed.  Constitutional:      General: She is not in acute distress.    Appearance: She is well-developed. She is not diaphoretic.  HENT:     Head: Normocephalic and atraumatic.     Nose: Nose normal.  Eyes:     General: No scleral icterus.       Right eye: No discharge.        Left eye: No discharge.     Conjunctiva/sclera: Conjunctivae normal.     Pupils: Pupils are equal, round, and reactive to light.  Neck:   Cardiovascular:     Rate and Rhythm: Normal rate and regular rhythm.     Heart sounds: No murmur heard.    No friction rub. No gallop.  Pulmonary:     Effort: Pulmonary effort is normal. No respiratory distress.     Breath sounds: Normal breath sounds. No stridor. No rales.  Abdominal:     General: There is no distension.     Palpations: Abdomen is soft.     Tenderness: There is no abdominal tenderness.  Musculoskeletal:     Cervical back: Normal range of motion and neck supple. Spasms and tenderness present. No torticollis or bony tenderness. Muscular tenderness present. No spinous process tenderness. Normal range of motion.     Thoracic back: Spasms and tenderness present. No bony tenderness.       Back:  Skin:    General: Skin is warm  and dry.     Findings: No erythema or rash.  Neurological:     Mental Status: She is alert and oriented to person, place, and time.     ED Results and Treatments Labs (all labs ordered are listed, but only abnormal results are displayed) Labs Reviewed  BASIC METABOLIC PANEL - Abnormal; Notable for the following components:      Result Value   Potassium 3.4 (*)    CO2 20 (*)    Glucose, Bld 101 (*)    All other components within normal limits  RESP PANEL BY RT-PCR (RSV, FLU A&B, COVID)  RVPGX2  CBC  TROPONIN I (HIGH SENSITIVITY)  TROPONIN I (HIGH SENSITIVITY)                                                                                                                         EKG  EKG Interpretation Date/Time:  Friday September 25 2023 14:28:12 EST Ventricular Rate:  83 PR Interval:  168 QRS Duration:  84 QT Interval:  356 QTC Calculation: 418 R Axis:   27  Text Interpretation: Sinus rhythm with marked sinus arrhythmia Otherwise normal ECG When compared with ECG of 26-Nov-2021 19:05, PREVIOUS ECG IS PRESENT Otherwise no significant change Confirmed by Trine Likes [id number]) on 09/25/2023 10:51:49 PM       Radiology DG Chest 2 View Result Date: 09/25/2023 CLINICAL DATA:  Chest pain. EXAM: CHEST - 2 VIEW COMPARISON:  December 14, 2021. FINDINGS: The heart size and mediastinal contours are within normal limits.  Both lungs are clear. The visualized skeletal structures are unremarkable. IMPRESSION: No active cardiopulmonary disease. Electronically Signed   By: Lynwood Landy Raddle M.D.   On: 09/25/2023 14:40   CT Head Wo Contrast Result Date: 09/25/2023 CLINICAL DATA:  Provided history: Headache, new onset. EXAM: CT HEAD WITHOUT CONTRAST TECHNIQUE: Contiguous axial images were obtained from the base of the skull through the vertex without intravenous contrast. RADIATION DOSE REDUCTION: This exam was performed according to the departmental dose-optimization program which includes automated  exposure control, adjustment of the mA and/or kV according to patient size and/or use of iterative reconstruction technique. COMPARISON:  None. FINDINGS: Brain: Mild generalized parenchymal volume loss. There is no acute intracranial hemorrhage. No demarcated cortical infarct. No extra-axial fluid collection. No evidence of an intracranial mass. No midline shift. Vascular: No hyperdense vessel.  Atherosclerotic calcifications. Skull: No calvarial fracture or aggressive osseous lesion. Sinuses/Orbits: No mass or acute finding within the imaged orbits. Minimal mucosal thickening within the bilateral ethmoid and maxillary sinuses at the imaged levels. Other: Small left mastoid effusion. IMPRESSION: 1.  No evidence of an acute intracranial abnormality. 2. Mild generalized parenchymal atrophy. 3. Minimal paranasal sinus mucosal thickening at the imaged levels. 4. Small left mastoid effusion. Electronically Signed   By: Rockey Childs D.O.   On: 09/25/2023 13:56    Medications Ordered in ED Medications  ketorolac  (TORADOL ) 15 MG/ML injection 15 mg (15 mg Intravenous Given 09/25/23 2324)  prochlorperazine  (COMPAZINE ) injection 5 mg (5 mg Intravenous Given 09/25/23 2324)  methocarbamol  (ROBAXIN ) tablet 750 mg (750 mg Oral Given 09/25/23 2325)  alum & mag hydroxide-simeth (MAALOX/MYLANTA) 200-200-20 MG/5ML suspension 30 mL (30 mLs Oral Given 09/25/23 2333)   Procedures Procedures  (including critical care time) Medical Decision Making / ED Course   Medical Decision Making Amount and/or Complexity of Data Reviewed Labs: ordered. Decision-making details documented in ED Course. Radiology: ordered and independent interpretation performed. Decision-making details documented in ED Course. ECG/medicine tests: ordered and independent interpretation performed. Decision-making details documented in ED Course.  Risk OTC drugs. Prescription drug management.    Headache, neck and upper back pain/chest pain Differential  diagnosis listed below  Presentation favors muscle strain/spasm of the bilateral shoulder and low back region. There may be a component of IV fluid GI related issue from esophagitis and recent NSAID use.  Given age and lack of healthcare, workup obtained and ruled out for ACS.  EKG without acute ischemic changes.  Serial troponins negative x 2.  CBC without leukocytosis or anemia.  Metabolic panel without significant electrolyte derangements or renal sufficiency. Chest x-ray without evidence of pneumonia, pneumothorax, pulmonary edema pleural effusions. CT head obtained negative for ICH, mass effect.  Chest pain is not suspicious for PE, dissection.  No emesis concerning for esophageal perforation.  Will check COVID, influenza, RSV for possible source of myalgia. Will treat supportively.  Viral swab negative.  Headache improving.     Final Clinical Impression(s) / ED Diagnoses Final diagnoses:  Muscle strain of upper back  Muscle tension headache  Chest wall pain   The patient appears reasonably screened and/or stabilized for discharge and I doubt any other medical condition or other [facility] requiring further screening, evaluation, or treatment in the ED at this time. I have discussed the findings, Dx and Tx plan with the patient/family who expressed understanding and agree(s) with the plan. Discharge instructions discussed at length. The patient/family was given strict return precautions who verbalized understanding of the instructions. No further questions at time  of discharge.  Disposition: Discharge  Condition: Good  ED Discharge Orders          Ordered    methocarbamol  (ROBAXIN ) 500 MG tablet  2 times daily        09/25/23 2320             Follow Up: Primary care provider  Schedule an appointment as soon as possible for a visit  if you do not have a primary care physician, contact [facility] at [phone] for referral    This chart was dictated using voice  recognition software.  Despite best efforts to proofread,  errors can occur which can change the documentation meaning.    Trine Raynell Moder, MD 09/26/23 [phone]

## 2023-09-26 LAB — RESP PANEL BY RT-PCR (RSV, FLU A&B, COVID)  RVPGX2
Influenza A by PCR: NEGATIVE
Influenza B by PCR: NEGATIVE
Resp Syncytial Virus by PCR: NEGATIVE
SARS Coronavirus 2 by RT PCR: NEGATIVE

## 2024-08-19 ENCOUNTER — Other Ambulatory Visit: Payer: Self-pay

## 2024-08-19 ENCOUNTER — Encounter (HOSPITAL_BASED_OUTPATIENT_CLINIC_OR_DEPARTMENT_OTHER): Payer: Self-pay

## 2024-08-19 ENCOUNTER — Emergency Department (HOSPITAL_BASED_OUTPATIENT_CLINIC_OR_DEPARTMENT_OTHER)

## 2024-08-19 ENCOUNTER — Emergency Department (HOSPITAL_BASED_OUTPATIENT_CLINIC_OR_DEPARTMENT_OTHER)
Admission: EM | Admit: 2024-08-19 | Discharge: 2024-08-19 | Disposition: A | Discharge status: Home / Self Care | Source: Home / Self Care | Attending: Emergency Medicine | Admitting: Emergency Medicine

## 2024-08-19 DIAGNOSIS — K5900 Constipation, unspecified: Secondary | ICD-10-CM | POA: Diagnosis not present

## 2024-08-19 DIAGNOSIS — R109 Unspecified abdominal pain: Secondary | ICD-10-CM | POA: Diagnosis present

## 2024-08-19 LAB — URINALYSIS, ROUTINE W REFLEX MICROSCOPIC
Bacteria, UA: NONE SEEN
Glucose, UA: NEGATIVE mg/dL
Ketones, ur: NEGATIVE mg/dL
Leukocytes,Ua: NEGATIVE
Nitrite: NEGATIVE
Protein, ur: 30 mg/dL — AB
Specific Gravity, Urine: 1.02 (ref 1.005–1.030)
pH: 7 (ref 5.0–8.0)

## 2024-08-19 LAB — TROPONIN T, HIGH SENSITIVITY: Troponin T High Sensitivity: <15 ng/L (ref 0–19)

## 2024-08-19 LAB — LIPASE, BLOOD: Lipase: 26 U/L (ref 11–51)

## 2024-08-19 LAB — CBC WITH DIFFERENTIAL/PLATELET
Abs Immature Granulocytes: 0.02 K/uL (ref 0.00–0.07)
Basophils Absolute: 0 K/uL (ref 0.0–0.1)
Basophils Relative: 1 %
Eosinophils Absolute: 0.1 K/uL (ref 0.0–0.5)
Eosinophils Relative: 1 %
HCT: 43 % (ref 36.0–46.0)
Hemoglobin: 14.4 g/dL (ref 12.0–15.0)
Immature Granulocytes: 0 %
Lymphocytes Relative: 21 %
Lymphs Abs: 1.2 K/uL (ref 0.7–4.0)
MCH: 29.4 pg (ref 26.0–34.0)
MCHC: 33.5 g/dL (ref 30.0–36.0)
MCV: 87.8 fL (ref 80.0–100.0)
Monocytes Absolute: 0.5 K/uL (ref 0.1–1.0)
Monocytes Relative: 8 %
Neutro Abs: 4.1 K/uL (ref 1.7–7.7)
Neutrophils Relative %: 69 %
Platelets: 184 K/uL (ref 150–400)
RBC: 4.9 MIL/uL (ref 3.87–5.11)
RDW: 13.4 % (ref 11.5–15.5)
WBC: 5.9 K/uL (ref 4.0–10.5)
nRBC: 0 % (ref 0.0–0.2)

## 2024-08-19 LAB — COMPREHENSIVE METABOLIC PANEL WITH GFR
ALT: 8 U/L (ref 0–44)
AST: 17 U/L (ref 15–41)
Albumin: 4.3 g/dL (ref 3.5–5.0)
Alkaline Phosphatase: 109 U/L (ref 38–126)
Anion gap: 13 (ref 5–15)
BUN: 7 mg/dL — ABNORMAL LOW (ref 8–23)
CO2: 25 mmol/L (ref 22–32)
Calcium: 9.9 mg/dL (ref 8.9–10.3)
Chloride: 103 mmol/L (ref 98–111)
Creatinine, Ser: 0.69 mg/dL (ref 0.44–1.00)
GFR, Estimated: >60 mL/min
Glucose, Bld: 103 mg/dL — ABNORMAL HIGH (ref 70–99)
Potassium: 3.7 mmol/L (ref 3.5–5.1)
Sodium: 140 mmol/L (ref 135–145)
Total Bilirubin: 0.6 mg/dL (ref 0.0–1.2)
Total Protein: 7.6 g/dL (ref 6.5–8.1)

## 2024-08-19 MED ORDER — SODIUM CHLORIDE 0.9 % IV BOLUS
500.0000 mL | Freq: Once | INTRAVENOUS | Status: AC
  Administered 2024-08-19: 500 mL via INTRAVENOUS

## 2024-08-19 MED ORDER — IOHEXOL 300 MG/ML  SOLN
100.0000 mL | Freq: Once | INTRAMUSCULAR | Status: AC | PRN
  Administered 2024-08-19: 100 mL via INTRAVENOUS

## 2024-08-19 MED ORDER — ONDANSETRON HCL 4 MG/2ML IJ SOLN
4.0000 mg | Freq: Once | INTRAMUSCULAR | Status: AC
  Administered 2024-08-19: 4 mg via INTRAVENOUS
  Filled 2024-08-19: qty 2

## 2024-08-19 NOTE — ED Triage Notes (Signed)
 Complaining of constipation since December 27. Took a laxative yesterday and it caused some liquid stool and pain in the upper abdomen. She said that today she doesn't feel right.

## 2024-08-19 NOTE — ED Provider Notes (Signed)
 " [place] EMERGENCY DEPARTMENT AT [facility] Provider Note   CSN: [id number] Arrival date & time: 08/19/24  9065     Patient presents with: Abdominal Pain   Alexandra Houston is a 64 y.o. female.   64 year old female presents today for concern of constipation since December 27.  Has had minimal bowel movement since and some diarrhea this morning after taking a laxative her sister gave her yesterday.  Reports significant abdominal discomfort that radiates up to her chest.  Endorses nausea since this morning.  She states she struggles with constipation from time to time but typically when she takes a laxative and increases her hydration this resolves it.  Outside of the radiating chest pressure she denies any shortness of breath or chest pain prior to the constipation.  She denies dysuria.  The history is provided by the patient. No language interpreter was used.       Prior to Admission medications  Medication Sig Start Date End Date Taking? Authorizing Provider  albuterol  (VENTOLIN  HFA) 108 (90 Base) MCG/ACT inhaler Inhale 1-2 puffs into the lungs every 6 (six) hours as needed for wheezing or shortness of breath. Patient not taking: Reported on 11/26/2021 04/23/20   Adah Wilbert LABOR, FNP  benzonatate  (TESSALON ) 100 MG capsule Take 1 capsule (100 mg total) by mouth every 8 (eight) hours. Patient not taking: Reported on 11/26/2021 04/23/20   Adah Wilbert A, FNP  ibuprofen  (ADVIL ) 600 MG tablet Take 1 tablet (600 mg total) by mouth every 6 (six) hours as needed. 11/26/21   Enedelia Dorna HERO, FNP  methocarbamol  (ROBAXIN ) 500 MG tablet Take 1 tablet (500 mg total) by mouth 2 (two) times daily. 09/25/23   Trine Raynell Moder, MD  naproxen  (NAPROSYN ) 500 MG tablet Take 1 tablet (500 mg total) by mouth 2 (two) times daily. 02/26/23   White, Shelba SAUNDERS, NP  tiZANidine  (ZANAFLEX ) 4 MG tablet Take 1 tablet (4 mg total) by mouth every 6 (six) hours as needed for muscle spasms. Patient not taking:  Reported on 11/26/2021 04/23/20   Adah Wilbert LABOR, FNP  dicyclomine  (BENTYL ) 20 MG tablet Take 1 tablet (20 mg total) by mouth 4 (four) times daily as needed for spasms. 05/05/18 02/25/19  Harris, Abigail, PA-C    Allergies: Penicillins    Review of Systems  Constitutional:  Negative for chills and fever.  Respiratory:  Negative for shortness of breath.   Cardiovascular:  Negative for chest pain.  Gastrointestinal:  Positive for abdominal pain, constipation and nausea. Negative for vomiting.  All other systems reviewed and are negative.   Updated Vital Signs BP (!) 178/99 (BP Location: Right Arm)   Pulse 86   Temp 98.2 F (36.8 C) (Oral)   Resp 18   Ht 5' 6 (1.676 m)   Wt 95.3 kg   SpO2 99%   BMI 33.89 kg/m   Physical Exam Vitals and nursing note reviewed.  Constitutional:      General: She is not in acute distress.    Appearance: Normal appearance. She is not ill-appearing.  HENT:     Head: Normocephalic and atraumatic.     Nose: Nose normal.  Eyes:     Conjunctiva/sclera: Conjunctivae normal.  Pulmonary:     Effort: Pulmonary effort is normal. No respiratory distress.  Abdominal:     General: There is distension.     Palpations: Abdomen is soft.     Tenderness: There is no abdominal tenderness. There is no guarding.  Musculoskeletal:  General: No deformity.  Skin:    Findings: No rash.  Neurological:     Mental Status: She is alert.     (all labs ordered are listed, but only abnormal results are displayed) Labs Reviewed  CBC WITH DIFFERENTIAL/PLATELET  COMPREHENSIVE METABOLIC PANEL WITH GFR  URINALYSIS, ROUTINE W REFLEX MICROSCOPIC  LIPASE, BLOOD  TROPONIN T, HIGH SENSITIVITY    EKG: None  Radiology: No results found.   Procedures   Medications Ordered in the ED  ondansetron  (ZOFRAN ) injection 4 mg (has no administration in time range)  sodium chloride  0.9 % bolus 500 mL (has no administration in time range)                                     Medical Decision Making Amount and/or Complexity of Data Reviewed Labs: ordered. Radiology: ordered.  Risk Prescription drug management.   Medical Decision Making / ED Course   This patient presents to the ED for concern of abdominal pain, this involves an extensive number of treatment options, and is a complaint that carries with it a high risk of complications and morbidity.  The differential diagnosis includes bowel obstruction, constipation, pancreatitis, colitis, gastroenteritis, atypical ACS  MDM: 64 year old female presents today for concern of abdominal distention, constipation, and abdominal pain.  Symptoms worsened today.  She did have some loose stools this morning after taking a laxative yesterday. Has some radiating chest pain. Will obtain labs, provide fluids, and obtain CT.  Will also obtain EKG and cardiac enzymes.  CBC and CBC without acute concerns.  Troponin negative.  UA with no evidence of UTI.  EKG without evidence of acute ischemic changes.  Low heart score.  Do not feel warrants repeat troponin given no typical anginal symptoms.  On reevaluation she does feel somewhat improved after the hydration.  CT without evidence of acute intra-abdominal process.  Likely constipation.  Discussed aggressive bowel regimen. Discussed follow-up with PCP. Strict return precautions discussed.  Patient voices understanding and is in agreement with plan.  Lab Tests: -I ordered, reviewed, and interpreted labs.   The pertinent results include:   Labs Reviewed  CBC WITH DIFFERENTIAL/PLATELET  COMPREHENSIVE METABOLIC PANEL WITH GFR  URINALYSIS, ROUTINE W REFLEX MICROSCOPIC  LIPASE, BLOOD  TROPONIN T, HIGH SENSITIVITY      EKG  EKG Interpretation Date/Time:    Ventricular Rate:    PR Interval:    QRS Duration:    QT Interval:    QTC Calculation:   R Axis:      Text Interpretation:           Imaging Studies ordered: I ordered imaging studies including CT  abdomen pelvis with contrast I independently visualized and interpreted imaging. I agree with the radiologist interpretation   Medicines ordered and prescription drug management: Meds ordered this encounter  Medications   ondansetron  (ZOFRAN ) injection 4 mg   sodium chloride  0.9 % bolus 500 mL    -I have reviewed the patients home medicines and have made adjustments as needed  Cardiac Monitoring: The patient was maintained on a cardiac monitor.  I personally viewed and interpreted the cardiac monitored which showed an underlying rhythm of: Normal sinus rhythm  Social Determinants of Health:  Factors impacting patients care include: Good outpatient follow-up   Reevaluation: After the interventions noted above, I reevaluated the patient and found that they have :improved  Co morbidities that complicate the patient evaluation  History reviewed. No pertinent past medical history.    Dispostion: Discharged in stable condition.  Return precautions discussed.  Patient voices understanding and is in agreement with plan.  Final diagnoses:  Constipation, unspecified constipation type    ED Discharge Orders     None          Hildegard Loge, PA-C 08/19/24 1245    Levander Houston, MD 09/01/24 1426  "

## 2024-08-19 NOTE — ED Notes (Signed)
 Reviewed AVS/discharge instruction with patient. Time allotted for and all questions answered. Patient is agreeable for d/c and escorted to ed exit by staff.

## 2024-08-19 NOTE — Discharge Instructions (Signed)
 Your CT scan and blood work looked reassuring.  No concerning findings.  You likely are constipated.  Continue drinking lots of fluids.  Take MiraLAX as discussed.  Take 3 capfuls for the first 2-3 days.  Until you feel like you are having good bowel movements then you can reduce this down to 1 capful a day.  Follow-up with your primary care doctor.  If anything changes or you have any concerning symptoms please return to the emergency room.
